# Patient Record
Sex: Male | Born: 1985 | Race: White | Hispanic: No | Marital: Single | Smoking: Never smoker
Health system: Southern US, Community
[De-identification: ages and names within clinical notes are randomized; demographics above are authoritative.]

---

## 2018-05-19 ENCOUNTER — Emergency Department: Payer: Self-pay

## 2018-05-19 ENCOUNTER — Other Ambulatory Visit: Payer: Self-pay

## 2018-05-19 ENCOUNTER — Encounter: Payer: Self-pay | Admitting: Emergency Medicine

## 2018-05-19 ENCOUNTER — Emergency Department
Admission: EM | Admit: 2018-05-19 | Discharge: 2018-05-19 | Disposition: A | Discharge status: Home / Self Care | Payer: Self-pay | Attending: Emergency Medicine | Admitting: Emergency Medicine

## 2018-05-19 DIAGNOSIS — K859 Acute pancreatitis without necrosis or infection, unspecified: Secondary | ICD-10-CM

## 2018-05-19 DIAGNOSIS — R748 Abnormal levels of other serum enzymes: Secondary | ICD-10-CM

## 2018-05-19 DIAGNOSIS — R197 Diarrhea, unspecified: Secondary | ICD-10-CM

## 2018-05-19 DIAGNOSIS — R112 Nausea with vomiting, unspecified: Secondary | ICD-10-CM

## 2018-05-19 DIAGNOSIS — F17200 Nicotine dependence, unspecified, uncomplicated: Secondary | ICD-10-CM | POA: Insufficient documentation

## 2018-05-19 LAB — URINALYSIS, COMPLETE (UACMP) WITH MICROSCOPIC
Bacteria, UA: NONE SEEN
Bilirubin Urine: NEGATIVE
Glucose, UA: NEGATIVE mg/dL
Hgb urine dipstick: NEGATIVE
Ketones, ur: 20 mg/dL — AB
LEUKOCYTES UA: NEGATIVE
Nitrite: NEGATIVE
PH: 5 (ref 5.0–8.0)
Protein, ur: NEGATIVE mg/dL
SPECIFIC GRAVITY, URINE: 1.02 (ref 1.005–1.030)

## 2018-05-19 LAB — COMPREHENSIVE METABOLIC PANEL
ALBUMIN: 4.6 g/dL (ref 3.5–5.0)
ALT: 16 U/L (ref 0–44)
AST: 18 U/L (ref 15–41)
Alkaline Phosphatase: 51 U/L (ref 38–126)
Anion gap: 11 (ref 5–15)
BUN: 13 mg/dL (ref 6–20)
CHLORIDE: 107 mmol/L (ref 98–111)
CO2: 23 mmol/L (ref 22–32)
CREATININE: 0.77 mg/dL (ref 0.61–1.24)
Calcium: 9.2 mg/dL (ref 8.9–10.3)
GFR calc Af Amer: >60 mL/min (ref >60)
GFR calc non Af Amer: >60 mL/min (ref >60)
GLUCOSE: 98 mg/dL (ref 70–99)
POTASSIUM: 3.5 mmol/L (ref 3.5–5.1)
Sodium: 141 mmol/L (ref 135–145)
Total Bilirubin: 0.9 mg/dL (ref 0.3–1.2)
Total Protein: 8 g/dL (ref 6.5–8.1)

## 2018-05-19 LAB — CBC
HEMATOCRIT: 44.2 % (ref 39.0–52.0)
HEMOGLOBIN: 15.2 g/dL (ref 13.0–17.0)
MCH: 29.1 pg (ref 26.0–34.0)
MCHC: 34.4 g/dL (ref 30.0–36.0)
MCV: 84.5 fL (ref 80.0–100.0)
Platelets: 265 10*3/uL (ref 150–400)
RBC: 5.23 MIL/uL (ref 4.22–5.81)
RDW: 12.3 % (ref 11.5–15.5)
WBC: 11.1 10*3/uL — ABNORMAL HIGH (ref 4.0–10.5)
nRBC: 0 % (ref 0.0–0.2)

## 2018-05-19 LAB — LIPASE, BLOOD: LIPASE: 97 U/L — AB (ref 11–51)

## 2018-05-19 MED ORDER — ONDANSETRON 4 MG PO TBDP
4.0000 mg | ORAL_TABLET | Freq: Once | ORAL | Status: AC
  Administered 2018-05-19: 4 mg via ORAL
  Filled 2018-05-19: qty 1

## 2018-05-19 MED ORDER — ONDANSETRON HCL 4 MG/2ML IJ SOLN: 4.0000 mg | Freq: Once | INTRAMUSCULAR | Status: DC

## 2018-05-19 MED ORDER — ONDANSETRON 4 MG PO TBDP: 4.0000 mg | ORAL_TABLET | Freq: Three times a day (TID) | ORAL | 0 refills | Status: DC | PRN

## 2018-05-19 MED ORDER — SODIUM CHLORIDE 0.9 % IV BOLUS: 1000.0000 mL | Freq: Once | INTRAVENOUS | Status: DC

## 2018-05-19 NOTE — ED Triage Notes (Signed)
First Nurse Note:  C/O N/V/D x 2 days.  AAOx3.  Skin warm and dry. Ambulates with easy and steady gait.  NAD

## 2018-05-19 NOTE — ED Provider Notes (Signed)
[facility] Emergency Department Provider Note  ____________________________________________  Time seen: Approximately 6:00 PM  I have reviewed the triage vital signs and the nursing notes.   HISTORY  Chief Complaint Emesis   HPI Frederick Parrish is a 32 y.o. male no significant past medical history who presents at the request of his employer for medical clearance for vomiting diarrhea.  Patient reports that his symptoms started yesterday.  He reports 5 episodes of nonbloody nonbilious emesis and more than 10 episodes of watery diarrhea.  Is complaining of mild epigastric abdominal pain that he rates as 2/10.  The pain is nonradiating and constant since this morning.  No fever but has had chills.  No history of C. difficile or recent antibiotic use, no melena, hematochezia, coffee-ground emesis or hematemesis.  No chest pain or shortness of breath.  Patient reports that he would have never come to the hospital for this but his employer requested medical clearance and he was unable to get in with his doctor.  PMH None -reviewed  Allergies Patient has no known allergies.  History reviewed. No pertinent family history.  Social History Social History   Tobacco Use  . Smoking status: Current Every Day Smoker  . Smokeless tobacco: Never Used  Substance Use Topics  . Alcohol use: Never    Frequency: Never  . Drug use: Yes    Types: Marijuana    Review of Systems  Constitutional: Negative for fever. Eyes: Negative for visual changes. ENT: Negative for sore throat. Neck: No neck pain  Cardiovascular: Negative for chest pain. Respiratory: Negative for shortness of breath. Gastrointestinal: + abdominal pain, vomiting and diarrhea. Genitourinary: Negative for dysuria. Musculoskeletal: Negative for back pain. Skin: Negative for rash. Neurological: Negative for headaches, weakness or numbness. Psych: No SI or  HI  ____________________________________________   PHYSICAL EXAM:  VITAL SIGNS: ED Triage Vitals  Enc Vitals Group     BP 05/19/18 1556 (!) 144/92     Pulse Rate 05/19/18 1556 94     Resp 05/19/18 1556 16     Temp --      Temp src --      SpO2 05/19/18 1556 99 %     Weight 05/19/18 1555 185 lb (83.9 kg)     Height 05/19/18 1555 5\' 9"  (1.753 m)     Head Circumference --      Peak Flow --      Pain Score 05/19/18 1555 0     Pain Loc --      Pain Edu? --      Excl. in GC? --     Constitutional: Alert and oriented. Well appearing and in no apparent distress. HEENT:      Head: Normocephalic and atraumatic.         Eyes: Conjunctivae are normal. Sclera is non-icteric.       Mouth/Throat: Mucous membranes are moist.       Neck: Supple with no signs of meningismus. Cardiovascular: Regular rate and rhythm. No murmurs, gallops, or rubs. 2+ symmetrical distal pulses are present in all extremities. No JVD. Respiratory: Normal respiratory effort. Lungs are clear to auscultation bilaterally. No wheezes, crackles, or rhonchi.  Gastrointestinal: Soft, mild epigastric ttp, and non distended with positive bowel sounds. No rebound or guarding. Genitourinary: No CVA tenderness. Musculoskeletal: Nontender with normal range of motion in all extremities. No edema, cyanosis, or erythema of extremities. Neurologic: Normal speech and language. Face is symmetric. Moving all extremities. No gross focal  neurologic deficits are appreciated. Skin: Skin is warm, dry and intact. No rash noted. Psychiatric: Mood and affect are normal. Speech and behavior are normal.  ____________________________________________   LABS (all labs ordered are listed, but only abnormal results are displayed)  Labs Reviewed  LIPASE, BLOOD - Abnormal; Notable for the following components:      Result Value   Lipase 97 (*)    All other components within normal limits  CBC - Abnormal; Notable for the following components:    WBC 11.1 (*)    All other components within normal limits  URINALYSIS, COMPLETE (UACMP) WITH MICROSCOPIC - Abnormal; Notable for the following components:   Color, Urine YELLOW (*)    APPearance CLEAR (*)    Ketones, ur 20 (*)    All other components within normal limits  COMPREHENSIVE METABOLIC PANEL   ____________________________________________  EKG  none  ____________________________________________  RADIOLOGY  I have personally reviewed the images performed during this visit and I agree with the Radiologist's read.   Interpretation by Radiologist:  US Abdomen Limited Ruq  Result Date: 05/19/2018 CLINICAL DATA:  Elevated lipase EXAM: ULTRASOUND ABDOMEN LIMITED RIGHT UPPER QUADRANT COMPARISON:  None. FINDINGS: Gallbladder: No gallstones or wall thickening visualized. No pericholecystic fluid. Tiny cholesterol crystals noted within the gallbladder. No sonographic Murphy sign noted by sonographer. Common bile duct: Diameter: 4 mm. No intrahepatic or extrahepatic biliary duct dilatation. Liver: No focal lesion identified. Within normal limits in parenchymal echogenicity. Portal vein is patent on color Doppler imaging with normal direction of blood flow towards the liver. No pancreatic mass or inflammatory focus evident. IMPRESSION: Tiny cholesterol crystals in gallbladder. Gallbladder otherwise appears normal. Study otherwise unremarkable. Electronically Signed   By: Bretta Bang III M.D.   On: 05/19/2018 18:12      ____________________________________________   PROCEDURES  Procedure(s) performed: None Procedures Critical Care performed:  None ____________________________________________   INITIAL IMPRESSION / ASSESSMENT AND PLAN / ED COURSE  32 y.o. male no significant past medical history who presents at the request of his employer for medical clearance for vomiting diarrhea.  Patient is well-appearing and in no distress, has normal vital signs, abdomen is soft  with mild epigastric tenderness.  Labs showing mildly elevated lipase at 97, normal CMP, mild leukocytosis of 11.1.  UA showing mild ketonuria.  Offer IV fluids however patient has refused and prefers to do p.o. hydration.  Will give Zofran and p.o. hydration.  We will send patient for right upper quadrant ultrasound to rule out gallstone pancreatitis since patient has no history of drinking alcohol.  Lipase could be also slightly elevated in the setting of vomiting from possible viral gastroenteritis.  Abdomen is otherwise benign with minimal tenderness in the epigastric region.  If ultrasound is negative patient passes p.o. challenge anticipate discharge home with Zofran, bland diet, and follow-up with primary care doctor.    _________________________ 7:14 PM on 05/19/2018 -----------------------------------------  Remains extremely well-appearing, tolerating p.o. with no difficulty, no further episodes of vomiting, abdomen remains benign with minimal tenderness in the epigastric region.  Ultrasound was done which shows tiny cholesterol crystals in the gallbladder with a normal common bile duct.  Due to this finding and elevated pancreatic enzymes I discussed the case with Dr. Duanne Guess, surgeon on-call who will see patient tomorrow at 2:30PM for follow-up as an outpatient as long as patient was tolerating p.o. discussed these recommendations with patient who feels comfortable with the plan.  Also recommended return to the emergency room if the  pain intensifies, if he develops a fever, or inability to tolerate p.o.   As part of my medical decision making, I reviewed the following data within the electronic MEDICAL RECORD NUMBER Nursing notes reviewed and incorporated, Labs reviewed , Old chart reviewed, Radiograph reviewed , A consult was requested and obtained from this/these consultant(s) Surgery, Notes from prior ED visits and [place] Controlled Substance Database    Pertinent labs & imaging results  that were available during my care of the patient were reviewed by me and considered in my medical decision making (see chart for details).    ____________________________________________   FINAL CLINICAL IMPRESSION(S) / ED DIAGNOSES  Final diagnoses:  Elevated lipase  Acute pancreatitis, unspecified complication status, unspecified pancreatitis type  Nausea vomiting and diarrhea      NEW MEDICATIONS STARTED DURING THIS VISIT:  ED Discharge Orders         Ordered    ondansetron (ZOFRAN ODT) 4 MG disintegrating tablet  Every 8 hours PRN     05/19/18 1909           Note:  This document was prepared using Dragon voice recognition software and may include unintentional dictation errors.    Nita SickleVeronese, [place], MD 05/19/18 [phone]

## 2018-05-19 NOTE — ED Triage Notes (Addendum)
NVD since yesterday. Vomit X 5 today and diarrhea all day per pt.  Unlabored, color WNL. VSS. Denies pain.  No urinary sx.  No fever.  Pt would also like work note.

## 2018-05-20 ENCOUNTER — Ambulatory Visit: Payer: Self-pay | Admitting: General Surgery

## 2019-05-26 ENCOUNTER — Other Ambulatory Visit: Payer: Self-pay

## 2019-05-26 DIAGNOSIS — Z20822 Contact with and (suspected) exposure to covid-19: Secondary | ICD-10-CM

## 2019-05-28 LAB — NOVEL CORONAVIRUS, NAA: SARS-CoV-2, NAA: NOT DETECTED

## 2019-10-18 DIAGNOSIS — K0889 Other specified disorders of teeth and supporting structures: Secondary | ICD-10-CM | POA: Diagnosis not present

## 2019-11-23 DIAGNOSIS — K0381 Cracked tooth: Secondary | ICD-10-CM | POA: Diagnosis not present

## 2019-12-20 DIAGNOSIS — K047 Periapical abscess without sinus: Secondary | ICD-10-CM | POA: Diagnosis not present

## 2020-01-27 DIAGNOSIS — M545 Low back pain: Secondary | ICD-10-CM | POA: Diagnosis not present

## 2020-01-27 DIAGNOSIS — M62830 Muscle spasm of back: Secondary | ICD-10-CM | POA: Diagnosis not present

## 2020-03-14 ENCOUNTER — Ambulatory Visit
Admission: EM | Admit: 2020-03-14 | Discharge: 2020-03-14 | Disposition: A | Discharge status: Home / Self Care | Payer: BC Managed Care – PPO | Attending: Family Medicine | Admitting: Family Medicine

## 2020-03-14 DIAGNOSIS — M5442 Lumbago with sciatica, left side: Secondary | ICD-10-CM

## 2020-03-14 MED ORDER — MELOXICAM 15 MG PO TABS: 15.0000 mg | ORAL_TABLET | Freq: Every day | ORAL | 0 refills | Status: DC | PRN

## 2020-03-14 NOTE — ED Triage Notes (Signed)
Patient in today w/ c/o sciatic pain. Patient states this is a recurring issue. Sx onset x 2 days.

## 2020-03-14 NOTE — ED Provider Notes (Signed)
[facility]    CSN: [id number] Arrival date & time: 03/14/20  1235      History   Chief Complaint Chief Complaint  Patient presents with  . Back Pain  . Sciatica   HPI  34 year old male presents with the above complaint.  2 day history of low back pain. Left sided. Radiates down the back into buttock/posterior thigh. Has had this previously and usually improves with aspirin and rest. Missed work due to this and is in need of a work note today. Worse with activity. Pain currently 5/10 in severity.  Home Medications    Prior to Admission medications   Medication Sig Start Date End Date Taking? Authorizing Provider  meloxicam (MOBIC) 15 MG tablet Take 1 tablet (15 mg total) by mouth daily as needed for pain. 03/14/20   Tommie Sams, DO   Social History Social History   Tobacco Use  . Smoking status: Current Every Day Smoker  . Smokeless tobacco: Never Used  Substance Use Topics  . Alcohol use: Never  . Drug use: Yes    Types: Marijuana     Allergies   Patient has no known allergies.   Review of Systems Review of Systems  Constitutional: Negative.   Musculoskeletal: Positive for back pain.   Physical Exam Triage Vital Signs ED Triage Vitals  Enc Vitals Group     BP 03/14/20 1313 (!) 147/103     Pulse Rate 03/14/20 1313 (!) 106     Resp 03/14/20 1313 18     Temp 03/14/20 1313 98.2 F (36.8 C)     Temp Source 03/14/20 1313 Oral     SpO2 03/14/20 1313 100 %     Weight 03/14/20 1315 185 lb (83.9 kg)     Height 03/14/20 1315 5\' 9"  (1.753 m)     Head Circumference --      Peak Flow --      Pain Score 03/14/20 1315 5     Pain Loc --      Pain Edu? --      Excl. in GC? --    Updated Vital Signs BP (!) 147/103 (BP Location: Left Arm)   Pulse (!) 106   Temp 98.2 F (36.8 C) (Oral)   Resp 18   Ht 5\' 9"  (1.753 m)   Wt 83.9 kg   SpO2 100%   BMI 27.32 kg/m   Visual Acuity Right Eye Distance:   Left Eye Distance:   Bilateral Distance:     Right Eye Near:   Left Eye Near:    Bilateral Near:     Physical Exam Vitals and nursing note reviewed.  Constitutional:      General: He is not in acute distress.    Appearance: Normal appearance. He is not ill-appearing.  HENT:     Head: Normocephalic and atraumatic.  Eyes:     General:        Right eye: No discharge.        Left eye: No discharge.     Conjunctiva/sclera: Conjunctivae normal.  Cardiovascular:     Rate and Rhythm: Normal rate and regular rhythm.  Pulmonary:     Effort: Pulmonary effort is normal.     Breath sounds: Normal breath sounds.  Musculoskeletal:     Comments: No significant tenderness of the lumbar spine.  Neurological:     Mental Status: He is alert.  Psychiatric:        Mood and Affect: Mood normal.  Behavior: Behavior normal.    UC Treatments / Results  Labs (all labs ordered are listed, but only abnormal results are displayed) Labs Reviewed - No data to display  EKG   Radiology No results found.  Procedures Procedures (including critical care time)  Medications Ordered in UC Medications - No data to display  Initial Impression / Assessment and Plan / UC Course  I have reviewed the triage vital signs and the nursing notes.  Pertinent labs & imaging results that were available during my care of the patient were reviewed by me and considered in my medical decision making (see chart for details).    34 year old male presents with acute low back pain with sciatica. Treating with Mobic. Work note given.   Final Clinical Impressions(s) / UC Diagnoses   Final diagnoses:  Acute left-sided low back pain with left-sided sciatica   Discharge Instructions   None    ED Prescriptions    Medication Sig Dispense Auth. Provider   meloxicam (MOBIC) 15 MG tablet Take 1 tablet (15 mg total) by mouth daily as needed for pain. 30 tablet Tommie Sams, DO     PDMP not reviewed this encounter.   Tommie Sams, [place] 03/14/20  2128

## 2020-04-12 DIAGNOSIS — Z20822 Contact with and (suspected) exposure to covid-19: Secondary | ICD-10-CM | POA: Diagnosis not present

## 2020-04-12 DIAGNOSIS — Z03818 Encounter for observation for suspected exposure to other biological agents ruled out: Secondary | ICD-10-CM | POA: Diagnosis not present

## 2020-04-25 DIAGNOSIS — N50812 Left testicular pain: Secondary | ICD-10-CM | POA: Diagnosis not present

## 2020-07-10 DIAGNOSIS — K5900 Constipation, unspecified: Secondary | ICD-10-CM | POA: Diagnosis not present

## 2020-07-11 ENCOUNTER — Encounter: Payer: Self-pay | Admitting: Family Medicine

## 2020-07-11 ENCOUNTER — Other Ambulatory Visit: Payer: Self-pay

## 2020-07-11 ENCOUNTER — Ambulatory Visit (INDEPENDENT_AMBULATORY_CARE_PROVIDER_SITE_OTHER): Payer: BC Managed Care – PPO | Admitting: Family Medicine

## 2020-07-11 VITALS — BP 128/72 | HR 90 | Ht 69.0 in | Wt 182.0 lb

## 2020-07-11 DIAGNOSIS — K4090 Unilateral inguinal hernia, without obstruction or gangrene, not specified as recurrent: Secondary | ICD-10-CM | POA: Diagnosis not present

## 2020-07-11 DIAGNOSIS — Z2821 Immunization not carried out because of patient refusal: Secondary | ICD-10-CM

## 2020-07-11 DIAGNOSIS — Z7689 Persons encountering health services in other specified circumstances: Secondary | ICD-10-CM | POA: Diagnosis not present

## 2020-07-11 NOTE — Patient Instructions (Addendum)
Thank you for coming to the office today.  Stay tuned for apt from General Surgery for more information.   DUE for FASTING BLOOD WORK (no food or drink after midnight before the lab appointment, only water or coffee without cream/sugar on the morning of)  SCHEDULE "Lab Only" visit in the morning at the clinic for lab draw in 6 MONTHS   - Make sure Lab Only appointment is at about 1 week before your next appointment, so that results will be available  For Lab Results, once available within 2-3 days of blood draw, you can can log in to MyChart online to view your results and a brief explanation. Also, we can discuss results at next follow-up visit.   Please schedule a Follow-up Appointment to: Return in about 6 months (around 01/08/2021) for 6 month fasting lab only then 1 week later Annual Physical.  If you have any other questions or concerns, please feel free to call the office or send a message through MyChart. You may also schedule an earlier appointment if necessary.  Additionally, you may be receiving a survey about your experience at our office within a few days to 1 week by e-mail or mail. We value your feedback.  Saralyn Pilar, DO [facility], [place]

## 2020-07-11 NOTE — Progress Notes (Signed)
Subjective:    Patient ID: Frederick Parrish, male    DOB: 11/01/1985, 35 y.o.   MRN: [id number]  Frederick Parrish is a 35 y.o. male presenting on 07/11/2020 for Establish Care and Back Pain  Here to establish care with new PCP.  HPI   Right Low Back Pain / Right Groin Pain - possible Hernia  Reports symptoms onset >4 months with episodic problem with pain R sided possible hernia, he has a concern with Right lower abdominal or inguinal symptoms with pressure and possible bulging or pushing from inside causing pain, seems to be episodic not every day, and Right low back pain worsening with more physical nature, worse only with working and lifting. - No prior hernia in past - No prior surgery or abdominal procedure - He does a lot of lifting for work. He works for [employer], works Warden/ranger, he does some machine, with some lifting and stamping.  He admits days when working more and lifting more he will have worse symptoms. Tried Ibuprofen as needed with some relief of back pain.  He had prior urgent care walk in visit 03/2020 for similar issue, dx with left testicular pain, had urinalysis and no obvious hernia on exam, they gave him Doxycycline.  He has worse symptoms with laying down can feel bulging.  Admits constipation. Tried vegetable juice for bowels. He admits occasional episodes of this often. History of Sciatica with low back pain.  He takes Kratom PRN with some urinary stream difficulties.   Plantar Wart, Left foot Reports onset plantar wart, he has done a freezing treatment with good results, he may try this again, it hasn't resolved.    Depression screen PHQ 2/9 07/11/2020  Decreased Interest 0  Down, Depressed, Hopeless 0  PHQ - 2 Score 0    History reviewed. No pertinent past medical history. History reviewed. No pertinent surgical history. Social History   Socioeconomic History  . Marital status: Single    Spouse name: Not on file  .  Number of children: Not on file  . Years of education: Not on file  . Highest education level: Not on file  Occupational History  . Not on file  Tobacco Use  . Smoking status: Never Smoker  . Smokeless tobacco: Never Used  Vaping Use  . Vaping Use: Some days  Substance and Sexual Activity  . Alcohol use: Never  . Drug use: Yes    Types: Marijuana  . Sexual activity: Not on file  Other Topics Concern  . Not on file  Social History Narrative  . Not on file   Social Determinants of Health   Financial Resource Strain: Not on file  Food Insecurity: Not on file  Transportation Needs: Not on file  Physical Activity: Not on file  Stress: Not on file  Social Connections: Not on file  Intimate Partner Violence: Not on file   Family History  Problem Relation Age of Onset  . Alcohol abuse Mother   . Diabetes Paternal Grandfather   . Cancer Neg Hx    Current Outpatient Medications on File Prior to Visit  Medication Sig  . Ascorbic Acid (VITAMIN C) 1000 MG tablet Take 1,000 mg by mouth daily.  . Multiple Vitamin (MULTIVITAMIN) tablet Take 1 tablet by mouth daily.   No current facility-administered medications on file prior to visit.    Review of Systems Per HPI unless specifically indicated above     Objective:    BP 128/72  Pulse 90   Ht 5\' 9"  (1.753 m)   Wt 182 lb (82.6 kg)   SpO2 100%   BMI 26.88 kg/m   Wt Readings from Last 3 Encounters:  07/11/20 182 lb (82.6 kg)  03/14/20 185 lb (83.9 kg)  05/19/18 185 lb (83.9 kg)    Physical Exam Vitals and nursing note reviewed.  Constitutional:      General: He is not in acute distress.    Appearance: He is well-developed and well-nourished. He is not diaphoretic.     Comments: Well-appearing, comfortable, cooperative  HENT:     Head: Normocephalic and atraumatic.     Mouth/Throat:     Mouth: Oropharynx is clear and moist.  Eyes:     General:        Right eye: No discharge.        Left eye: No discharge.      Conjunctiva/sclera: Conjunctivae normal.  Cardiovascular:     Rate and Rhythm: Normal rate.  Pulmonary:     Effort: Pulmonary effort is normal.  Abdominal:     General: Bowel sounds are normal. There is no distension.     Palpations: Abdomen is soft. There is no mass.     Tenderness: There is no abdominal tenderness. There is no guarding or rebound.     Hernia: No hernia (No Abdominal hernia) is present.  Genitourinary:    Penis: Normal.      Testes: Normal.     Comments: Normal external genital exam. Normal hernia exam without any provoked herniation or bulging bilateral inguinal canals. He does have some mild provoked symptoms on deep palpation at R inguinal canal opening. Musculoskeletal:        General: No edema.     Comments: Palpable muscle hypertonicity knot on R low back paraspinal lumbar  Skin:    General: Skin is warm and dry.     Findings: No erythema or rash.  Neurological:     Mental Status: He is alert and oriented to person, place, and time.  Psychiatric:        Mood and Affect: Mood and affect normal.        Behavior: Behavior normal.     Comments: Well groomed, good eye contact, normal speech and thoughts    Results for orders placed or performed in visit on 05/26/19  Novel Coronavirus, NAA ([facility])   Specimen: Nasopharyngeal(NP) swabs in vial transport medium   NASOPHARYNGE  TESTING  Result Value Ref Range   SARS-CoV-2, NAA Not Detected Not Detected      Assessment & Plan:   Problem List Items Addressed This Visit    COVID-19 vaccination declined    Other Visit Diagnoses    Right inguinal hernia    -  Primary   Relevant Orders   Ambulatory referral to General Surgery   Encounter to establish care with new doctor          to general surgery for evaluation of possible Right inguinal hernia, given >3 months of episodic classic hernia symptoms with increasing pain and pressure bulging symptoms but then can have episodes of no pain. He does heavy lifting  for work and without obvious injury but still would like to rule out hernia first, then can pursue other options, may warrant imaging if difficult to determine, I could not reproduce his symptoms or hernia.   Alternatively symptoms may be related to chronic low back pain, R sided sciatica.    No orders of the defined types  were placed in this encounter.  Orders Placed This Encounter  Procedures  . Ambulatory referral to General Surgery    Referral Priority:   Routine    Referral Type:   Surgical    Referral Reason:   Specialty Services Required    Requested Specialty:   General Surgery    Number of Visits Requested:   1      Follow up plan: Return in about 6 months (around 01/08/2021) for 6 month fasting lab only then 1 week later Annual Physical.  Saralyn Pilar, DO [facility] 07/11/2020, 3:25 PM

## 2020-07-12 ENCOUNTER — Ambulatory Visit
Admission: EM | Admit: 2020-07-12 | Discharge: 2020-07-12 | Disposition: A | Discharge status: Home / Self Care | Payer: BC Managed Care – PPO | Attending: Family Medicine | Admitting: Family Medicine

## 2020-07-12 ENCOUNTER — Encounter: Payer: Self-pay | Admitting: Emergency Medicine

## 2020-07-12 DIAGNOSIS — B349 Viral infection, unspecified: Secondary | ICD-10-CM | POA: Diagnosis not present

## 2020-07-12 DIAGNOSIS — U071 COVID-19: Secondary | ICD-10-CM | POA: Insufficient documentation

## 2020-07-12 LAB — RAPID INFLUENZA A&B ANTIGENS
Influenza A (ARMC): NEGATIVE
Influenza B (ARMC): NEGATIVE

## 2020-07-12 MED ORDER — KETOROLAC TROMETHAMINE 10 MG PO TABS: 10.0000 mg | ORAL_TABLET | Freq: Four times a day (QID) | ORAL | 0 refills | Status: DC | PRN

## 2020-07-12 NOTE — ED Provider Notes (Signed)
[facility]    CSN: [id number] Arrival date & time: 07/12/20  1321      History   Chief Complaint Chief Complaint  Patient presents with  . Constipation  . Fever  . Nausea  . Nasal Congestion  . Generalized Body Aches    HPI  35 year old male presents with above complaints.  Patient reports that his symptoms started yesterday.  He reports body aches, sore throat, headaches, fever.  He states nausea as well.  Also reports congestion.  No cough.  He has taken Motrin without resolution.  Patient recently been seen on Monday for constipation at an outside urgent care.  Also seen by his PCP yesterday.  He states that his symptoms started after he visited his PCP yesterday.  No relieving factors.  Has not been vaccinated.  No other complaints at this time.   Home Medications    Prior to Admission medications   Medication Sig Start Date End Date Taking? Authorizing Provider  ketorolac (TORADOL) 10 MG tablet Take 1 tablet (10 mg total) by mouth every 6 (six) hours as needed for moderate pain or severe pain. 07/12/20  Yes Uthman Mroczkowski G, DO  Ascorbic Acid (VITAMIN C) 1000 MG tablet Take 1,000 mg by mouth daily.    [provider]  Multiple Vitamin (MULTIVITAMIN) tablet Take 1 tablet by mouth daily.    [provider]    Family History Family History  Problem Relation Age of Onset  . Alcohol abuse Mother   . Diabetes Paternal Grandfather   . Cancer Neg Hx     Social History Social History   Tobacco Use  . Smoking status: Never Smoker  . Smokeless tobacco: Never Used  Vaping Use  . Vaping Use: Some days  Substance Use Topics  . Alcohol use: Never  . Drug use: Yes    Types: Marijuana     Allergies   Patient has no known allergies.   Review of Systems Review of Systems Per HPI  Physical Exam Triage Vital Signs ED Triage Vitals  Enc Vitals Group     BP 07/12/20 1457 122/84     Pulse Rate 07/12/20 1457 78     Resp 07/12/20 1457 17      Temp 07/12/20 1457 99.3 F (37.4 C)     Temp Source 07/12/20 1457 Oral     SpO2 07/12/20 1457 98 %     Weight --      Height --      Head Circumference --      Peak Flow --      Pain Score 07/12/20 1455 6     Pain Loc --      Pain Edu? --      Excl. in GC? --    Updated Vital Signs BP 122/84 (BP Location: Left Arm)   Pulse 78   Temp 99.3 F (37.4 C) (Oral)   Resp 17   SpO2 98%   Visual Acuity Right Eye Distance:   Left Eye Distance:   Bilateral Distance:    Right Eye Near:   Left Eye Near:    Bilateral Near:     Physical Exam Vitals and nursing note reviewed.  Constitutional:      General: He is not in acute distress.    Appearance: Normal appearance. He is not ill-appearing.  HENT:     Head: Normocephalic and atraumatic.     Mouth/Throat:     Pharynx: Oropharynx is clear. No oropharyngeal exudate.  Cardiovascular:     Rate and Rhythm: Normal rate and regular rhythm.     Heart sounds: No murmur heard.   Pulmonary:     Effort: Pulmonary effort is normal.     Breath sounds: Normal breath sounds. No wheezing, rhonchi or rales.  Neurological:     Mental Status: He is alert.  Psychiatric:        Mood and Affect: Mood normal.        Behavior: Behavior normal.    UC Treatments / Results  Labs (all labs ordered are listed, but only abnormal results are displayed) Labs Reviewed  RAPID INFLUENZA A&B ANTIGENS  SARS CORONAVIRUS 2 (TAT 6-24 HRS)    EKG   Radiology No results found.  Procedures Procedures (including critical care time)  Medications Ordered in UC Medications - No data to display  Initial Impression / Assessment and Plan / UC Course  I have reviewed the triage vital signs and the nursing notes.  Pertinent labs & imaging results that were available during my care of the patient were reviewed by me and considered in my medical decision making (see chart for details).    35 year old male presents with viral illness.  Flu negative.   Concern for COVID-19.  Toradol for body aches.  Supportive care.  Final Clinical Impressions(s) / UC Diagnoses   Final diagnoses:  Viral illness     Discharge Instructions     Medication as prescribed.  Stay home.  Check my chart for COVID test results.  Take care  Dr. Adriana Simas     ED Prescriptions    Medication Sig Dispense Auth. Provider   ketorolac (TORADOL) 10 MG tablet Take 1 tablet (10 mg total) by mouth every 6 (six) hours as needed for moderate pain or severe pain. 20 tablet Tommie Sams, DO     PDMP not reviewed this encounter.   Tommie Sams, [place] 07/12/20 1637

## 2020-07-12 NOTE — Discharge Instructions (Signed)
Medication as prescribed.  Stay home.  Check my chart for COVID test results.  Take care  Dr. Finesse Fielder   

## 2020-07-12 NOTE — ED Triage Notes (Signed)
Pt states that he has  HA, sore throat, body aches, nasal congestion, fever, and nausea. Pt states that his sx started yesterday excluding constipation which started Monday. Pt states that he took four Ibuprofen this morning.

## 2020-07-13 LAB — SARS CORONAVIRUS 2 (TAT 6-24 HRS): SARS Coronavirus 2: POSITIVE — AB

## 2020-07-14 ENCOUNTER — Telehealth: Payer: Self-pay | Admitting: Nurse Practitioner

## 2020-07-14 NOTE — Telephone Encounter (Signed)
Attempted to contact patient for screening for Covid monoclonal antibody infusion or antiviral treatment.   Phone number listed for patient is incorrect.   Mychart message sent.

## 2020-07-20 ENCOUNTER — Ambulatory Visit: Payer: Self-pay | Admitting: Surgery

## 2020-07-27 ENCOUNTER — Encounter: Payer: Self-pay | Admitting: Surgery

## 2020-07-27 ENCOUNTER — Ambulatory Visit (INDEPENDENT_AMBULATORY_CARE_PROVIDER_SITE_OTHER): Payer: BC Managed Care – PPO | Admitting: Surgery

## 2020-07-27 ENCOUNTER — Other Ambulatory Visit: Payer: Self-pay

## 2020-07-27 VITALS — BP 144/88 | HR 96 | Temp 99.0°F | Ht 69.0 in | Wt 177.0 lb

## 2020-07-27 DIAGNOSIS — N50811 Right testicular pain: Secondary | ICD-10-CM | POA: Diagnosis not present

## 2020-07-27 DIAGNOSIS — N50812 Left testicular pain: Secondary | ICD-10-CM

## 2020-07-27 NOTE — Progress Notes (Signed)
Patient ID: Frederick Parrish, male   DOB: 1985/07/11, 35 y.o.   MRN: [id number]  Chief Complaint: Right testicular pain.  History of Present Illness Frederick Parrish is a 35 y.o. male with a multimonth history of right testicular pain.  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  He denies any prior sense of a bulge or mass in the groin region.  The pain is primarily exacerbated when laying supine, and seems to be alleviated when he changes position.  The pain is primarily right testicular which radiates into the perineum and perianal area.  He denies any association with sexual activity, however he is concerned about a diminished libido.  Does not appear to have any of the pain at work or when he is upright.  Concerns have been raised previously of intermittent torsion. He denies voiding issues.  Past Medical History History reviewed. No pertinent past medical history.    History reviewed. No pertinent surgical history.  No Known Allergies  Current Outpatient Medications  Medication Sig Dispense Refill  . Ascorbic Acid (VITAMIN C) 1000 MG tablet Take 1,000 mg by mouth daily.    . Multiple Vitamin (MULTIVITAMIN) tablet Take 1 tablet by mouth daily.     No current facility-administered medications for this visit.    Family History Family History  Problem Relation Age of Onset  . Alcohol abuse Mother   . Diabetes Paternal Grandfather   . Cancer Neg Hx       Social History Social History   Tobacco Use  . Smoking status: Never Smoker  . Smokeless tobacco: Never Used  Vaping Use  . Vaping Use: Some days  Substance Use Topics  . Alcohol use: Never  . Drug use: Yes    Types: Marijuana        Review of Systems  Constitutional: Negative.   HENT: Negative.   Eyes: Negative.   Respiratory: Negative.   Cardiovascular: Negative.   Gastrointestinal: Negative.   Genitourinary: Negative for  dysuria, hematuria and urgency.  Musculoskeletal: Negative.   Skin: Negative.   Neurological: Negative.   Psychiatric/Behavioral: Negative.       Physical Exam Blood pressure (!) 144/88, pulse 96, temperature 99 F (37.2 C), temperature source Oral, height 5\' 9"  (1.753 m), weight 177 lb (80.3 kg), SpO2 99 %. Last Weight  Most recent update: 07/27/2020  9:27 AM   Weight  80.3 kg (177 lb)            CONSTITUTIONAL: Well developed, and nourished, appropriately responsive and aware without distress.   EYES: Sclera non-icteric.   EARS, NOSE, MOUTH AND THROAT: Mask worn.     Hearing is intact to voice.  NECK: Trachea is midline, and there is no jugular venous distension.  LYMPH NODES:  Lymph nodes in the neck are not enlarged. RESPIRATORY:  Lungs are clear, and breath sounds are equal bilaterally. Normal respiratory effort without pathologic use of accessory muscles. CARDIOVASCULAR: Heart is regular in rate and rhythm. GI: The abdomen is  soft, nontender, and nondistended. There were no palpable masses. I did not appreciate hepatosplenomegaly. There were normal bowel sounds. GU: quite protective during exam, tender right scrotal area, no palpable testicular abnormality with limited exam.  No appreciable inguinal hernia or either side.   MUSCULOSKELETAL:  Symmetrical muscle tone appreciated in all four extremities.    SKIN: Skin turgor is normal. No pathologic skin lesions appreciated.  NEUROLOGIC:  Motor and sensation appear grossly normal.  Cranial nerves are grossly without defect. PSYCH:  Alert and oriented to person, place and time. Affect is appropriate for situation.  Data Reviewed I have personally reviewed what is currently available of the patient's imaging, recent labs and medical records.   Labs:  CBC Latest Ref Rng & Units 05/19/2018  WBC 4.0 - 10.5 K/uL 11.1(H)  Hemoglobin 13.0 - 17.0 g/dL 28.3  Hematocrit 66.2 - 52.0 % 44.2  Platelets 150 - 400 K/uL 265   CMP Latest  Ref Rng & Units 05/19/2018  Glucose 70 - 99 mg/dL 98  BUN 6 - 20 mg/dL 13  Creatinine 9.47 - 6.54 mg/dL 6.50  Sodium 354 - 656 mmol/L 141  Potassium 3.5 - 5.1 mmol/L 3.5  Chloride 98 - 111 mmol/L 107  CO2 22 - 32 mmol/L 23  Calcium 8.9 - 10.3 mg/dL 9.2  Total Protein 6.5 - 8.1 g/dL 8.0  Total Bilirubin 0.3 - 1.2 mg/dL 0.9  Alkaline Phos 38 - 126 U/L 51  AST 15 - 41 U/L 18  ALT 0 - 44 U/L 16      Imaging: Radiology review:  Limited abd u/s 2019.   Within last 24 hrs: No results found.  Assessment    Right testicular pain.  Patient Active Problem List   Diagnosis Date Noted  . COVID-19 vaccination declined 07/11/2020    Plan    Consider scrotal u/s, as for now will proceed with pelvic CT and f/u after.   Face-to-face time spent with the patient and accompanying care providers(if present) was 30 minutes, with more than 50% of the time spent counseling, educating, and coordinating care of the patient.      Campbell Lerner M.D., FACS 07/27/2020, 9:50 AM

## 2020-07-27 NOTE — Patient Instructions (Addendum)
CT scan Pelvis scheduled for 08/01/20 @ 2:15 pm @ [facility]patient [facility]. Nothing to eat or drink 4 hours prior- Go to outpatient imaging today to pick up your prep kit and instructions.   [place]   Please see your follow up appointment listed below.

## 2020-07-31 ENCOUNTER — Other Ambulatory Visit: Payer: Self-pay

## 2020-07-31 ENCOUNTER — Ambulatory Visit
Admission: RE | Admit: 2020-07-31 | Discharge: 2020-07-31 | Disposition: A | Discharge status: Home / Self Care | Payer: BC Managed Care – PPO | Source: Ambulatory Visit | Attending: Surgery | Admitting: Surgery

## 2020-07-31 DIAGNOSIS — M545 Low back pain, unspecified: Secondary | ICD-10-CM | POA: Diagnosis not present

## 2020-07-31 DIAGNOSIS — R109 Unspecified abdominal pain: Secondary | ICD-10-CM | POA: Diagnosis not present

## 2020-07-31 DIAGNOSIS — N50812 Left testicular pain: Secondary | ICD-10-CM | POA: Diagnosis not present

## 2020-07-31 DIAGNOSIS — N433 Hydrocele, unspecified: Secondary | ICD-10-CM | POA: Diagnosis not present

## 2020-07-31 DIAGNOSIS — N50811 Right testicular pain: Secondary | ICD-10-CM | POA: Diagnosis not present

## 2020-07-31 MED ORDER — IOHEXOL 300 MG/ML  SOLN
100.0000 mL | Freq: Once | INTRAMUSCULAR | Status: AC | PRN
  Administered 2020-07-31: 100 mL via INTRAVENOUS

## 2020-08-01 ENCOUNTER — Ambulatory Visit: Admission: RE | Admit: 2020-08-01 | Payer: BC Managed Care – PPO | Source: Ambulatory Visit

## 2020-08-02 ENCOUNTER — Other Ambulatory Visit: Payer: Self-pay | Admitting: Surgery

## 2020-08-02 DIAGNOSIS — N50811 Right testicular pain: Secondary | ICD-10-CM

## 2020-08-02 DIAGNOSIS — N50812 Left testicular pain: Secondary | ICD-10-CM

## 2020-08-03 ENCOUNTER — Ambulatory Visit (INDEPENDENT_AMBULATORY_CARE_PROVIDER_SITE_OTHER): Payer: BC Managed Care – PPO | Admitting: Surgery

## 2020-08-03 ENCOUNTER — Telehealth: Payer: Self-pay

## 2020-08-03 ENCOUNTER — Other Ambulatory Visit: Payer: Self-pay

## 2020-08-03 ENCOUNTER — Encounter: Payer: Self-pay | Admitting: Surgery

## 2020-08-03 VITALS — BP 125/82 | HR 101 | Temp 98.8°F | Ht 69.0 in | Wt 176.6 lb

## 2020-08-03 DIAGNOSIS — N50811 Right testicular pain: Secondary | ICD-10-CM

## 2020-08-03 DIAGNOSIS — N50812 Left testicular pain: Secondary | ICD-10-CM | POA: Diagnosis not present

## 2020-08-03 NOTE — Telephone Encounter (Signed)
Attempted to call pt to let him know that his [place] has been scheduled for 08/07/2020. Arrive at 10:45 am, appt is at 11 am. Unable to LVM.

## 2020-08-03 NOTE — Progress Notes (Signed)
Patient ID: Frederick Parrish, male   DOB: 06/27/1986, 35 y.o.   MRN: [id number]  Chief Complaint: Right testicular pain.  History of Present Illness Frederick Parrish is a 35 y.o. male who returns in follow-up after his CT scan examination with history of right testicular pain.  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  He denies any prior sense of a bulge or mass in the groin region.  The pain is primarily exacerbated when laying supine, and seems to be alleviated when he changes position.  The pain is primarily right testicular which radiates into the perineum and perianal area.  He denies any association with sexual activity, however he is concerned about a diminished libido.  Does not appear to have any of the pain at work or when he is upright.  Concerns have been raised previously of intermittent torsion. He denies voiding issues.  Past Medical History History reviewed. No pertinent past medical history.    History reviewed. No pertinent surgical history.  No Known Allergies  Current Outpatient Medications  Medication Sig Dispense Refill  . Ascorbic Acid (VITAMIN C) 1000 MG tablet Take 1,000 mg by mouth daily.    . Multiple Vitamin (MULTIVITAMIN) tablet Take 1 tablet by mouth daily.     No current facility-administered medications for this visit.    Family History Family History  Problem Relation Age of Onset  . Alcohol abuse Mother   . Diabetes Paternal Grandfather   . Cancer Neg Hx       Social History Social History   Tobacco Use  . Smoking status: Never Smoker  . Smokeless tobacco: Never Used  Vaping Use  . Vaping Use: Some days  Substance Use Topics  . Alcohol use: Never  . Drug use: Yes    Types: Marijuana        Review of Systems  Constitutional: Negative.   HENT: Negative.   Eyes: Negative.   Respiratory: Negative.   Cardiovascular: Negative.   Gastrointestinal:  Negative.   Genitourinary: Negative for dysuria, hematuria and urgency.  Musculoskeletal: Negative.   Skin: Negative.   Neurological: Negative.   Psychiatric/Behavioral: Negative.       Physical Exam Blood pressure 125/82, pulse (!) 101, temperature 98.8 F (37.1 C), temperature source Oral, height 5\' 9"  (1.753 m), weight 176 lb 9.6 oz (80.1 kg), SpO2 99 %. Last Weight  Most recent update: 08/03/2020  9:19 AM   Weight  80.1 kg (176 lb 9.6 oz)            CONSTITUTIONAL: Well developed, and nourished, appropriately responsive and aware without distress.   EYES: Sclera non-icteric.   EARS, NOSE, MOUTH AND THROAT: Mask worn.     Hearing is intact to voice.  NECK: Trachea is midline, and there is no jugular venous distension.  LYMPH NODES:  Lymph nodes in the neck are not enlarged. RESPIRATORY:  Lungs are clear, and breath sounds are equal bilaterally. Normal respiratory effort without pathologic use of accessory muscles. CARDIOVASCULAR: Heart is regular in rate and rhythm. GI: The abdomen is  soft, nontender, and nondistended. There were no palpable masses. I did not appreciate hepatosplenomegaly. There were normal bowel sounds. GU: quite protective during exam, tender right scrotal area, no palpable testicular abnormality with limited exam.  No appreciable inguinal hernia or either side.   MUSCULOSKELETAL:  Symmetrical muscle tone appreciated in all four extremities.    SKIN:  Skin turgor is normal. No pathologic skin lesions appreciated.  NEUROLOGIC:  Motor and sensation appear grossly normal.  Cranial nerves are grossly without defect. PSYCH:  Alert and oriented to person, place and time. Affect is appropriate for situation.  Data Reviewed I have personally reviewed what is currently available of the patient's imaging, recent labs and medical records.   Labs:  CBC Latest Ref Rng & Units 05/19/2018  WBC 4.0 - 10.5 K/uL 11.1(H)  Hemoglobin 13.0 - 17.0 g/dL 34.7  Hematocrit 42.5 -  52.0 % 44.2  Platelets 150 - 400 K/uL 265   CMP Latest Ref Rng & Units 05/19/2018  Glucose 70 - 99 mg/dL 98  BUN 6 - 20 mg/dL 13  Creatinine 9.56 - 3.87 mg/dL 5.64  Sodium 332 - 951 mmol/L 141  Potassium 3.5 - 5.1 mmol/L 3.5  Chloride 98 - 111 mmol/L 107  CO2 22 - 32 mmol/L 23  Calcium 8.9 - 10.3 mg/dL 9.2  Total Protein 6.5 - 8.1 g/dL 8.0  Total Bilirubin 0.3 - 1.2 mg/dL 0.9  Alkaline Phos 38 - 126 U/L 51  AST 15 - 41 U/L 18  ALT 0 - 44 U/L 16      Imaging: Radiology review:  Limited abd u/s 2019.    CLINICAL DATA:  Testicular, low back and intermittent right-sided flank pain for 1 year.  EXAM: CT PELVIS WITH CONTRAST  TECHNIQUE: Multidetector CT imaging of the pelvis was performed using the standard protocol following the bolus administration of intravenous contrast.  CONTRAST:  OMNIPAQUE IOHEXOL 300 MG/ML  SOLN  COMPARISON:  None.  FINDINGS: Urinary Tract:  Distal ureters and urinary bladder are unremarkable.  Bowel: High attenuation enteric contrast media traverses to the level of the distal sigmoid. No conspicuous mural thickening, dilatation or evidence of bowel obstruction within the included portions the large or small bowel. Normal appendix in the right lower quadrant.  Vascular/Lymphatic: No pathologically enlarged lymph nodes. No significant vascular abnormality seen.  Reproductive: Normal appearance of the prostate and seminal vesicles. Question a trace left hydrocele and small scrotolith, incompletely characterized on this exam. Additionally, there may be some mild prominence of the vascularity in the inguinal canal/scrotum which could suggest a varicocele though would be better characterized on dedicated scrotal sonography. No other gross abnormality of the included external genitalia.  Other: Minimal fat seen within the proximal inguinal canals. No bowel containing hernias. No pelvic free air or fluid.  Musculoskeletal: No  acute osseous abnormality or suspicious osseous lesion.  IMPRESSION: Trace left hydrocele and small scrotolith, and possible varicoceles incompletely characterized on this exam. Consider dedicated scrotal ultrasound.  Minimal protrusion of fat into the proximal inguinal canals without bowel containing hernia.  No other acute or worrisome abnormalities.   Electronically Signed   By: Kreg Shropshire M.D.   On: 07/31/2020 22:33  Within last 24 hrs: No results found.    Assessment    Right testicular pain.  Associated lower right lumbar back pain with history of sciatica reported. Patient Active Problem List   Diagnosis Date Noted  . COVID-19 vaccination declined 07/11/2020    Plan    Scrotal u/s, urological consultation, and f/u after 6 wks.   Face-to-face time spent with the patient and accompanying care providers(if present) was 15 minutes, with more than 50% of the time spent counseling, educating, and coordinating care of the patient.      Campbell Lerner M.D., FACS 08/03/2020, 10:35 AM

## 2020-08-03 NOTE — Patient Instructions (Addendum)
We have put in a referral to Urology. They will call you to set up an appointment. I will call you with your appointment for your ultrasound. See follow up appointment below.

## 2020-08-07 ENCOUNTER — Ambulatory Visit
Admission: RE | Admit: 2020-08-07 | Discharge: 2020-08-07 | Disposition: A | Discharge status: Home / Self Care | Payer: BC Managed Care – PPO | Source: Ambulatory Visit | Attending: Surgery | Admitting: Surgery

## 2020-08-07 ENCOUNTER — Other Ambulatory Visit: Payer: Self-pay

## 2020-08-07 DIAGNOSIS — N5089 Other specified disorders of the male genital organs: Secondary | ICD-10-CM | POA: Diagnosis not present

## 2020-08-07 DIAGNOSIS — N433 Hydrocele, unspecified: Secondary | ICD-10-CM | POA: Diagnosis not present

## 2020-08-07 DIAGNOSIS — N50811 Right testicular pain: Secondary | ICD-10-CM | POA: Diagnosis not present

## 2020-08-07 DIAGNOSIS — N50812 Left testicular pain: Secondary | ICD-10-CM | POA: Diagnosis not present

## 2020-08-07 DIAGNOSIS — Z8679 Personal history of other diseases of the circulatory system: Secondary | ICD-10-CM | POA: Diagnosis not present

## 2020-08-11 ENCOUNTER — Ambulatory Visit: Payer: BC Managed Care – PPO | Admitting: Urology

## 2020-09-14 ENCOUNTER — Other Ambulatory Visit: Payer: Self-pay

## 2020-09-14 ENCOUNTER — Ambulatory Visit (INDEPENDENT_AMBULATORY_CARE_PROVIDER_SITE_OTHER): Payer: BC Managed Care – PPO | Admitting: Surgery

## 2020-09-14 ENCOUNTER — Encounter: Payer: Self-pay | Admitting: Surgery

## 2020-09-14 VITALS — BP 129/75 | HR 106 | Temp 98.4°F | Ht 69.0 in | Wt 175.0 lb

## 2020-09-14 DIAGNOSIS — N50811 Right testicular pain: Secondary | ICD-10-CM

## 2020-09-14 DIAGNOSIS — N50812 Left testicular pain: Secondary | ICD-10-CM

## 2020-09-14 NOTE — Progress Notes (Addendum)
[facility] Clinic Progress/Follow-up Note   HPI:  35 y.o. Male presents to clinic for follow-up of testicular/perineal/anal rectal pain.  He reports he is significantly improved in regard to his pain issues.  Reports that his pain resolved following his ultrasound.  Never had a visit with a urologist yet.  Reports he has always had nocturia x2 during the evenings.  Reports he has also had some significant harder bowel movements that may be contributing to the pain.  He brought up the abnormality noted in his testicular ultrasound, and is well aware that he needs follow-up with urologist and repeat imaging. Review of Systems:  Constitutional: denies fever/chills  ENT: denies sore throat, hearing problems  Respiratory: denies shortness of breath, wheezing  Cardiovascular: denies chest pain, palpitations  Gastrointestinal: denies abdominal pain, N/V, or diarrhea/and bowel function Skin: Denies any other rashes or skin discolorations   Vital Signs:  BP 129/75   Pulse (!) 106   Temp 98.4 F (36.9 C) (Oral)   Ht 5\' 9"  (1.753 m)   Wt 175 lb (79.4 kg)   SpO2 98%   BMI 25.84 kg/m    Physical Exam:  Constitutional:  -- Normal body habitus  -- Awake, alert, and oriented x3  Pulmonary:  -- No crackles -- Equal breath sounds bilaterally -- Breathing non-labored at rest Cardiovascular:  -- S1, S2 present  -- No pericardial rubs  Gastrointestinal:  -- Soft and non-distended, non-tender GU  --exam not repeated. Musculoskeletal / Integumentary:  -- Wounds or skin discoloration: None appreciated  -- Extremities: B/L UE and LE FROM, hands and feet warm, no edema   Laboratory studies:  Radiology review: Testicular ultrasound report noted.  Imaging: No new pertinent imaging available for review   Assessment:  35 y.o. yo Male with a problem list including...  Patient Active Problem List   Diagnosis Date Noted  . COVID-19 vaccination declined 07/11/2020    presents to clinic for follow-up  evaluation of testicular perineal perianal pain, apparently resolved at present.  Progressing well.  Plan:   Reviewed follow-up with urologist as recommended on testicular ultrasound, with repeat imaging per interval. We discussed the role of fiber and fluids with adequacy of bowel function and avoiding.  Advised to pursue a goal of 25 to 30 g of fiber daily.  Made aware that the majority of this may be through natural sources, but advised to be aware of actual consumption and to ensure minimal consumption by daily supplementation.  Various forms of supplements discussed.  Strongly advised to consume more fluids to ensure adequate hydration, instructed to watch color of urine to determine adequacy of hydration.  Clarity is pursued in urine output, and bowel activity that correlates to significant meal intake.  Patient is to avoid deferring having bowel movements, advised to take the time at the first sign of sensation, typically following meals and in the morning.  Subsequent utilization of MiraLAX to ensure at least daily movement, ideally twice daily bowel movements.  If multiple doses of MiraLAX are necessary utilize them.             - return to clinic as needed, instructed to call office if any questions or concerns  All of the above recommendations were discussed with the patient, and all of patient'squestions were answered to his expressed satisfaction.  09/08/2020, MD, FACS [place]:  [facility] - Partnering for exceptional care. Office: [phone]

## 2020-09-14 NOTE — Patient Instructions (Addendum)
Eat foods high in fiber and drink plenty of water for constipation. Make an appointment with Urology. If you have any concerns or questions, please feel free to call our office.       High-Fiber Eating Plan Fiber, also called dietary fiber, is a type of carbohydrate. It is found foods such as fruits, vegetables, whole grains, and beans. A high-fiber diet can have many health benefits. Your health care provider may recommend a high-fiber diet to help:  Prevent constipation. Fiber can make your bowel movements more regular.  Lower your cholesterol.  Relieve the following conditions: ? Inflammation of veins in the anus (hemorrhoids). ? Inflammation of specific areas of the digestive tract (uncomplicated diverticulosis). ? A problem of the large intestine, also called the colon, that sometimes causes pain and diarrhea (irritable bowel syndrome, or IBS).  Prevent overeating as part of a weight-loss plan.  Prevent heart disease, type 2 diabetes, and certain cancers. What are tips for following this plan? Reading food labels  Check the nutrition facts label on food products for the amount of dietary fiber. Choose foods that have 5 grams of fiber or more per serving.  The goals for recommended daily fiber intake include: ? Men (age 53 or younger): 34-38 g. ? Men (over age 39): 28-34 g. ? Women (age 63 or younger): 25-28 g. ? Women (over age 71): 22-25 g. Your daily fiber goal is _____________ g.   Shopping  Choose whole fruits and vegetables instead of processed forms, such as apple juice or applesauce.  Choose a wide variety of high-fiber foods such as avocados, lentils, oats, and kidney beans.  Read the nutrition facts label of the foods you choose. Be aware of foods with added fiber. These foods often have high sugar and sodium amounts per serving. Cooking  Use whole-grain flour for baking and cooking.  Cook with brown rice instead of white rice. Meal planning  Start the day  with a breakfast that is high in fiber, such as a cereal that contains 5 g of fiber or more per serving.  Eat breads and cereals that are made with whole-grain flour instead of refined flour or white flour.  Eat brown rice, bulgur wheat, or millet instead of white rice.  Use beans in place of meat in soups, salads, and pasta dishes.  Be sure that half of the grains you eat each day are whole grains. General information  You can get the recommended daily intake of dietary fiber by: ? Eating a variety of fruits, vegetables, grains, nuts, and beans. ? Taking a fiber supplement if you are not able to take in enough fiber in your diet. It is better to get fiber through food than from a supplement.  Gradually increase how much fiber you consume. If you increase your intake of dietary fiber too quickly, you may have bloating, cramping, or gas.  Drink plenty of water to help you digest fiber.  Choose high-fiber snacks, such as berries, raw vegetables, nuts, and popcorn. What foods should I eat? Fruits Berries. Pears. Apples. Oranges. Avocado. Prunes and raisins. Dried figs. Vegetables Sweet potatoes. Spinach. Kale. Artichokes. Cabbage. Broccoli. Cauliflower. Green peas. Carrots. Squash. Grains Whole-grain breads. Multigrain cereal. Oats and oatmeal. Brown rice. Barley. Bulgur wheat. Millet. Quinoa. Bran muffins. Popcorn. Rye wafer crackers. Meats and other proteins Navy beans, kidney beans, and pinto beans. Soybeans. Split peas. Lentils. Nuts and seeds. Dairy Fiber-fortified yogurt. Beverages Fiber-fortified soy milk. Fiber-fortified orange juice. Other foods Fiber bars. The items listed  above may not be a complete list of recommended foods and beverages. Contact a dietitian for more information. What foods should I avoid? Fruits Fruit juice. Cooked, strained fruit. Vegetables Fried potatoes. Canned vegetables. Well-cooked vegetables. Grains White bread. Pasta made with refined  flour. White rice. Meats and other proteins Fatty cuts of meat. Fried chicken or fried fish. Dairy Milk. Yogurt. Cream cheese. Sour cream. Fats and oils Butters. Beverages Soft drinks. Other foods Cakes and pastries. The items listed above may not be a complete list of foods and beverages to avoid. Talk with your dietitian about what choices are best for you. Summary  Fiber is a type of carbohydrate. It is found in foods such as fruits, vegetables, whole grains, and beans.  A high-fiber diet has many benefits. It can help to prevent constipation, lower blood cholesterol, aid weight loss, and reduce your risk of heart disease, diabetes, and certain cancers.  Increase your intake of fiber gradually. Increasing fiber too quickly may cause cramping, bloating, and gas. Drink plenty of water while you increase the amount of fiber you consume.  The best sources of fiber include whole fruits and vegetables, whole grains, nuts, seeds, and beans. This information is not intended to replace advice given to you by your health care provider. Make sure you discuss any questions you have with your health care provider. Document Revised: 10/21/2019 Document Reviewed: 10/21/2019 Elsevier Patient Education  2021 [employer].

## 2021-02-14 ENCOUNTER — Ambulatory Visit: Payer: BC Managed Care – PPO | Admitting: Family Medicine

## 2021-02-14 ENCOUNTER — Encounter: Payer: Self-pay | Admitting: Family Medicine

## 2021-02-14 ENCOUNTER — Ambulatory Visit
Admission: RE | Admit: 2021-02-14 | Discharge: 2021-02-14 | Disposition: A | Discharge status: Home / Self Care | Payer: BC Managed Care – PPO | Source: Home / Self Care | Attending: Family Medicine | Admitting: Family Medicine

## 2021-02-14 ENCOUNTER — Ambulatory Visit
Admission: RE | Admit: 2021-02-14 | Discharge: 2021-02-14 | Disposition: A | Discharge status: Home / Self Care | Payer: BC Managed Care – PPO | Source: Ambulatory Visit | Attending: Family Medicine | Admitting: Family Medicine

## 2021-02-14 ENCOUNTER — Other Ambulatory Visit: Payer: Self-pay

## 2021-02-14 VITALS — BP 119/75 | HR 100 | Ht 69.0 in | Wt 167.2 lb

## 2021-02-14 DIAGNOSIS — Z23 Encounter for immunization: Secondary | ICD-10-CM | POA: Diagnosis not present

## 2021-02-14 DIAGNOSIS — M79642 Pain in left hand: Secondary | ICD-10-CM

## 2021-02-14 DIAGNOSIS — Z0389 Encounter for observation for other suspected diseases and conditions ruled out: Secondary | ICD-10-CM | POA: Diagnosis not present

## 2021-02-14 DIAGNOSIS — S61412A Laceration without foreign body of left hand, initial encounter: Secondary | ICD-10-CM | POA: Insufficient documentation

## 2021-02-14 DIAGNOSIS — M795 Residual foreign body in soft tissue: Secondary | ICD-10-CM | POA: Diagnosis not present

## 2021-02-14 NOTE — Patient Instructions (Addendum)
Thank you for coming to the office today.  Tetanus TDap shot today  X-ray left hand to look and confirm for metallic foreign body results later today  It may just be a puncture injury with some scar tissue on the tendon.  Can use warm water soaks to help draw out any possible sliver  Use topical antibiotic to protect skin - call if new concern or sign of redness or infection  Please schedule a Follow-up Appointment to: Return if symptoms worsen or fail to improve.  If you have any other questions or concerns, please feel free to call the office or send a message through MyChart. You may also schedule an earlier appointment if necessary.  Additionally, you may be receiving a survey about your experience at our office within a few days to 1 week by e-mail or mail. We value your feedback.  Saralyn Pilar, DO [facility], [place]

## 2021-02-14 NOTE — Progress Notes (Signed)
Subjective:    Patient ID: NYLEN CREQUE, male    DOB: 10-Dec-1985, 35 y.o.   MRN: [id number]  DELLA HOMAN is a 35 y.o. male presenting on 02/14/2021 for Foreign Body   HPI  Left Hand / Foreign Object metal Reports 2 weeks ago using hammer in R hand swung and hit a piece of metal and a sliver or piece of metal flew off quickly and hit directly into his Left hand that was at his side and it caused some bleeding and pain initially. It healed initially it was worse and now it has improved. Certain activities if he uses the Left hand will cause numbness and pain. Used some triple antibiotic ointment on it initially. It bleed initially then it resolved. He did some water soaks and it helped. - Has used a magnet to identify the piece of metal   Health Maintenance: TDap vaccine is due, overdue on tetanus  Depression screen [facility] 2/9 07/11/2020  Decreased Interest 0  Down, Depressed, Hopeless 0  PHQ - 2 Score 0    Social History   Tobacco Use   Smoking status: Never   Smokeless tobacco: Never  Vaping Use   Vaping Use: Some days  Substance Use Topics   Alcohol use: Never   Drug use: Yes    Types: Marijuana    Review of Systems Per HPI unless specifically indicated above     Objective:    BP 119/75   Pulse 100   Ht $R'5\' 9"'wg$  (1.753 m)   Wt 167 lb 3.2 oz (75.8 kg)   SpO2 95%   BMI 24.69 kg/m   Wt Readings from Last 3 Encounters:  02/14/21 167 lb 3.2 oz (75.8 kg)  09/14/20 175 lb (79.4 kg)  08/03/20 176 lb 9.6 oz (80.1 kg)    Physical Exam  _____________________________________________________________________ PROCEDURE NOTE Date: 02/14/21 Foreign Body Removal L Hand Verbal consent given by patient. Time out taken. Area cleaned with alcohol swabs. Local anesthesia with lidocaine 1% with epi 1 cc injected into site and surrounding tissue. Area prepped with betadine swabs x 3. Scalpel 15 blade used to make approx 1 cm superficial incision along center of possible  foreign body. With manipulation no successful identification of foreign body made. Laceration cleansed and steri strip placed.    Results for orders placed or performed during the hospital encounter of 07/12/20  SARS CORONAVIRUS 2 (TAT 6-24 HRS) Nasopharyngeal Nasopharyngeal Swab   Specimen: Nasopharyngeal Swab  Result Value Ref Range   SARS Coronavirus 2 POSITIVE (A) NEGATIVE  Rapid Influenza A&B Antigens   Specimen: Flu Kit Nasopharyngeal Swab; Respiratory  Result Value Ref Range   Influenza A ([facility]) NEGATIVE NEGATIVE   Influenza B ([facility]) NEGATIVE NEGATIVE      Assessment & Plan:   Problem List Items Addressed This Visit   None Visit Diagnoses     Left hand pain    -  Primary   Relevant Orders   DG Hand Complete Left   Need for diphtheria-tetanus-pertussis (Tdap) vaccine       Laceration of left hand, foreign body presence unspecified, initial encounter       Relevant Orders   Tdap vaccine greater than or equal to 7yo IM   DG Hand Complete Left       Possible Left hand retained metallic foreign body from hammering 2 weeks ago Site has since healed up, but he has felt sensation of retained foreign body Worse with movements repetitive impacting his tendon  Attempted exploration with superficial incision today, see procedure notes for details. Unsuccessful in locating and removing metallic foreign body. Not entirely convinced there is a retained foreign body after exam today.  Will pursue X-ray L hand today in office STAT to review and follow-up with patient.  Can use warm water soaks to help draw out any possible sliver  Use topical antibiotic to protect skin - call if new concern or sign of redness or infection   Follow-up as advised if not improving. We can consider consult with Gen Surg to remove it if needed.  Orders Placed This Encounter  Procedures   DG Hand Complete Left    Standing Status:   Future    Standing Expiration Date:   02/14/2022    Order Specific  Question:   Reason for Exam (SYMPTOM  OR DIAGNOSIS REQUIRED)    Answer:   left hand possible metal foreign body retained at base of thumb, possible metallic sliver    Order Specific Question:   Preferred imaging location?    Answer:   [facility]   Tdap vaccine greater than or equal to 7yo IM     No orders of the defined types were placed in this encounter.    Follow up plan: Return if symptoms worsen or fail to improve.  Nobie Putnam, [facility] 02/14/2021, 9:58 AM

## 2021-02-15 ENCOUNTER — Telehealth: Payer: Self-pay | Admitting: Family Medicine

## 2021-02-15 DIAGNOSIS — M79642 Pain in left hand: Secondary | ICD-10-CM

## 2021-02-15 DIAGNOSIS — S60552A Superficial foreign body of left hand, initial encounter: Secondary | ICD-10-CM

## 2021-02-15 NOTE — Telephone Encounter (Signed)
I called the pt to give him the information about the referral but he said he is at work and couldn't talk, so he asked me to call him back around 4pm tomorrow. I will f/u with him around that time.

## 2021-02-15 NOTE — Telephone Encounter (Signed)
I have called [facility] and they have scheduled the patient for a consultation with Gen Surgery for removal of the foreign body metal pieces in his Left hand.  Please notify patient that he has been scheduled for Thursday 8/25 next week at 9am at their office.  If he needs to re-schedule he should call them >24 hours in advance of that appointment.  It would be a "speciality copay visit", they will evaluate the hand and figure out a plan - either in office removal or other option. I am not 100% sure if they will be removing it on that first visit.  Saralyn Pilar, DO [facility] 02/15/2021, 9:28 AM

## 2021-02-19 NOTE — Telephone Encounter (Signed)
Pt called in and was informed of his upcoming new pt appt at [facility], wanted Irving Burton to know she does not need to call him back.

## 2021-02-21 NOTE — Telephone Encounter (Signed)
I have seen the message. Thank you

## 2021-02-22 ENCOUNTER — Encounter: Payer: Self-pay | Admitting: Surgery

## 2021-02-22 ENCOUNTER — Other Ambulatory Visit: Payer: Self-pay

## 2021-02-22 ENCOUNTER — Ambulatory Visit: Payer: BC Managed Care – PPO | Admitting: Surgery

## 2021-02-22 VITALS — BP 110/66 | HR 78 | Temp 98.0°F | Ht 69.0 in | Wt 165.0 lb

## 2021-02-22 DIAGNOSIS — S60552A Superficial foreign body of left hand, initial encounter: Secondary | ICD-10-CM | POA: Diagnosis not present

## 2021-02-22 DIAGNOSIS — S60552S Superficial foreign body of left hand, sequela: Secondary | ICD-10-CM

## 2021-02-22 DIAGNOSIS — S60512A Abrasion of left hand, initial encounter: Secondary | ICD-10-CM | POA: Diagnosis not present

## 2021-02-22 NOTE — Progress Notes (Signed)
Patient ID: Frederick Parrish, male   DOB: [birth year], 35 y.o.   MRN: [id number]  Chief Complaint: Foreign body left hand  History of Present Illness Frederick Parrish is a 35 y.o. male with known foreign bodies to the left hand x2.  Patient was disassembling/deconstructing air conditioning unit and felt shards of metal injure his left hand.  1 is in the snuffbox area, the other is dorsal to his fourth metacarpal joint.  This occurred 3 weeks ago attempts to remove/retrieve the foreign body at the snuffbox area were not successful.  There is always been a palpable scar in the area of its entry.  His only symptom is that he feels some twinge when he rubs his finger over the area that radiates down into his index finger.  If he does not touch the area he feels no pain.  Has no difficulty with range of motion of the left hand and is essentially asymptomatic. X-rays confirm the presence of the foreign bodies.    Past Medical History History reviewed. No pertinent past medical history.    History reviewed. No pertinent surgical history.  No Known Allergies  Current Outpatient Medications  Medication Sig Dispense Refill   Ascorbic Acid (VITAMIN C) 1000 MG tablet Take 1,000 mg by mouth daily.     Multiple Vitamin (MULTIVITAMIN) tablet Take 1 tablet by mouth daily.     omega-3 acid ethyl esters (LOVAZA) 1 g capsule Take 1 g by mouth 2 (two) times daily.     vitamin B-12 (CYANOCOBALAMIN) 500 MCG tablet Take 500 mcg by mouth daily.     No current facility-administered medications for this visit.    Family History Family History  Problem Relation Age of Onset   Alcohol abuse Mother    Cancer Paternal Grandfather    Diabetes Paternal Grandfather       Social History Social History   Tobacco Use   Smoking status: Never   Smokeless tobacco: Never  Vaping Use   Vaping Use: Some days  Substance Use Topics   Alcohol use: Never   Drug use: Yes    Types: Marijuana        Review of  Systems  Constitutional: Negative.   HENT: Negative.    Eyes: Negative.   Respiratory: Negative.    Cardiovascular: Negative.   Gastrointestinal: Negative.   Genitourinary: Negative.   Skin: Negative.   Neurological: Negative.   Psychiatric/Behavioral: Negative.    Reports a chest tightness experienced the same time he got very upset at work, this is only occurred 1 time recently, last night.  Physical Exam Blood pressure 110/66, pulse 78, temperature 98 F (36.7 C), height 5\' 9"  (1.753 m), weight 165 lb (74.8 kg), SpO2 97 %. Last Weight  Most recent update: 02/22/2021  9:19 AM    Weight  74.8 kg (165 lb)             CONSTITUTIONAL: Well developed, and nourished, appropriately responsive and aware without distress.   EYES: Sclera non-icteric.   EARS, NOSE, MOUTH AND THROAT: Mask worn.  Hearing is intact to voice.  RESPIRATORY:  Lungs are clear. Normal respiratory effort without pathologic use of accessory muscles.  A more exact CARDIOVASCULAR: Heart is regular in rate and rhythm. MUSCULOSKELETAL:  Symmetrical muscle tone appreciated in all four extremities.    SKIN: Skin turgor is normal. No pathologic skin lesions appreciated.  There is a small well-healed scar over the snuffbox and the left dorsal hand, I can only appreciate  feeling the scar.  Upon reviewing the x-rays, I am convinced I cannot possibly palpate the metallic fragment there.  On palpating this area is nontender and does not seem to create any significant distress or issue. NEUROLOGIC:  Motor and sensation appear grossly normal.  Cranial nerves are grossly without defect. PSYCH:  Alert and oriented to person, place and time. Affect is appropriate for situation.  Data Reviewed I have personally reviewed what is currently available of the patient's imaging, recent labs and medical records.   Labs:  CBC Latest Ref Rng & Units 05/19/2018  WBC 4.0 - 10.5 K/uL 11.1(H)  Hemoglobin 13.0 - 17.0 g/dL 86.5  Hematocrit  78.4 - 52.0 % 44.2  Platelets 150 - 400 K/uL 265   CMP Latest Ref Rng & Units 05/19/2018  Glucose 70 - 99 mg/dL 98  BUN 6 - 20 mg/dL 13  Creatinine 6.96 - 2.95 mg/dL 2.84  Sodium 132 - 440 mmol/L 141  Potassium 3.5 - 5.1 mmol/L 3.5  Chloride 98 - 111 mmol/L 107  CO2 22 - 32 mmol/L 23  Calcium 8.9 - 10.3 mg/dL 9.2  Total Protein 6.5 - 8.1 g/dL 8.0  Total Bilirubin 0.3 - 1.2 mg/dL 0.9  Alkaline Phos 38 - 126 U/L 51  AST 15 - 41 U/L 18  ALT 0 - 44 U/L 16      Imaging: Radiology review:  CLINICAL DATA:  Left hand possible foreign body.   EXAM: LEFT HAND - COMPLETE 3+ VIEW   COMPARISON:  None.   FINDINGS: No acute fracture or dislocation. No aggressive osseous lesion. Normal alignment.   Soft tissue are unremarkable. 3 mm metallic foreign body in the soft tissues dorsal to the fourth metacarpal head. 1-2 mm metallic foreign body along the radial aspect of the scaphoid-trapezium joint.   IMPRESSION: No acute osseous injury of the left hand.     Electronically Signed   By: Elige Ko M.D.   On: 02/14/2021 11:22 Within last 24 hrs: No results found.  Assessment    Metallic foreign bodies left hand.  Essentially asymptomatic aside from damage/scar resulting from the injury. Patient Active Problem List   Diagnosis Date Noted   COVID-19 vaccination declined 07/11/2020    Plan    I am quite certain that pursuing this tiny fragment would likely cause more harm than good.  Offered another opinion to see a Hydrographic surveyor.  I believe the patient is confident that leaving this fragment alone will not lead to any further morbidity.  And current morbidity essentially nonexistent aside from rubbing or palpating the area.  I would like to see him back as needed.  Face-to-face time spent with the patient and accompanying care providers(if present) was 25 minutes, with more than 50% of the time spent counseling, educating, and coordinating care of the patient.    These notes  generated with voice recognition software. I apologize for typographical errors.  Campbell Lerner M.D., FACS 02/22/2021, 9:49 AM

## 2021-02-22 NOTE — Patient Instructions (Signed)
Follow-up with our office as needed.  Please call and ask to speak with a nurse if you develop questions or concerns.   Let us know if you would like a referral to an orthopedic hand specialists referral.

## 2021-02-27 DIAGNOSIS — Z20822 Contact with and (suspected) exposure to covid-19: Secondary | ICD-10-CM | POA: Diagnosis not present

## 2021-03-23 ENCOUNTER — Encounter: Payer: Self-pay | Admitting: General Surgery

## 2022-11-17 IMAGING — US US SCROTUM W/ DOPPLER COMPLETE
1 series · 13 of 25 positions shown · non-contrast
Comparison: CT pelvis dated July 31, 2020

CLINICAL DATA: Varicocele.  Right testicular pain x4 months

EXAM:
SCROTAL ULTRASOUND
DOPPLER ULTRASOUND OF THE TESTICLES
TECHNIQUE: Complete ultrasound examination of the testicles, epididymis, and
other scrotal structures was performed. Color and spectral Doppler
ultrasound were also utilized to evaluate blood flow to the
testicles.

[Series 1: us scrotum w/ doppler complete · 0.07mm/px · 13 of 77 slices shown]
[im 1/77]
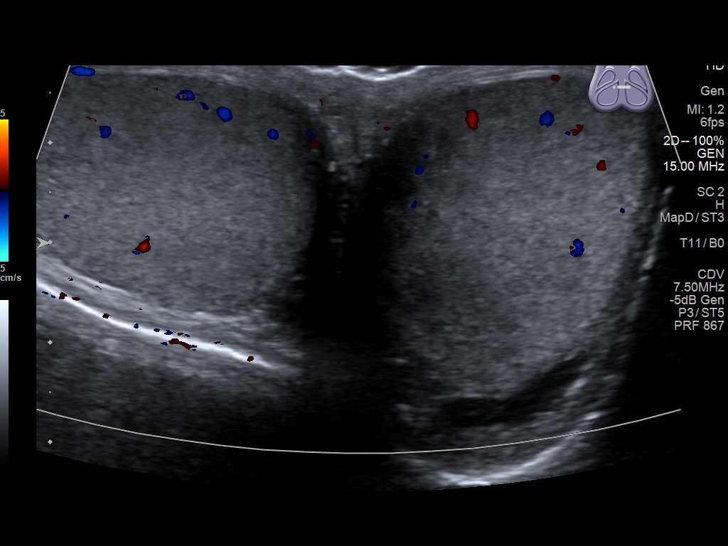
[im 7/77]
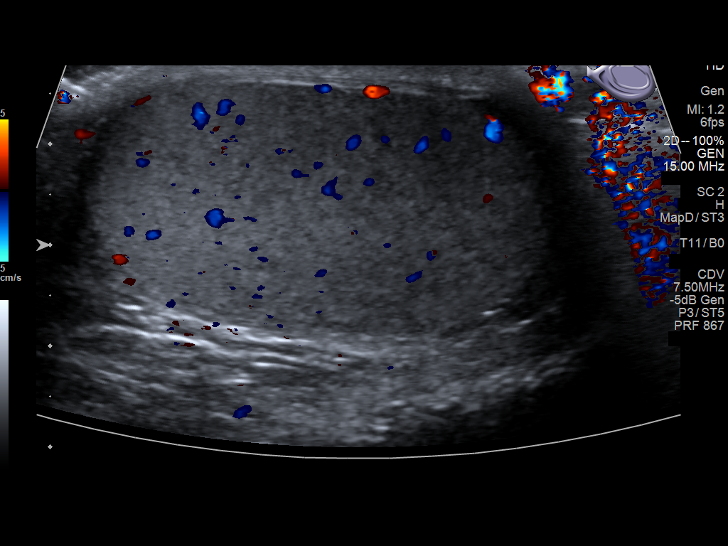
[im 13/77]
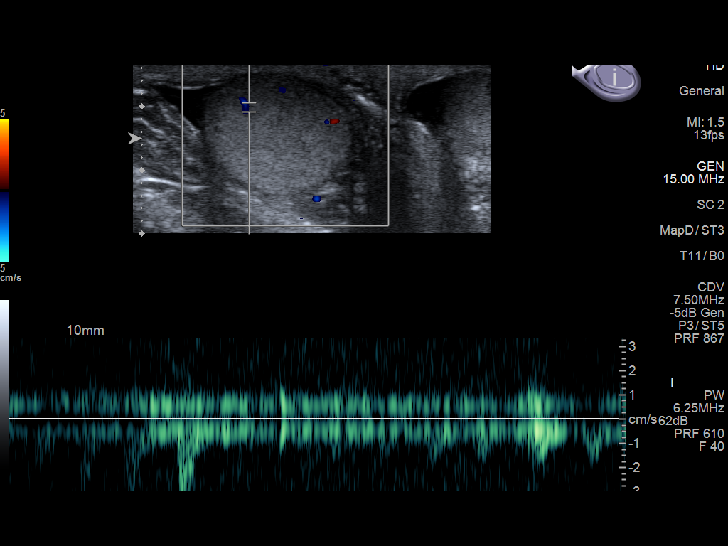
[im 20/77]
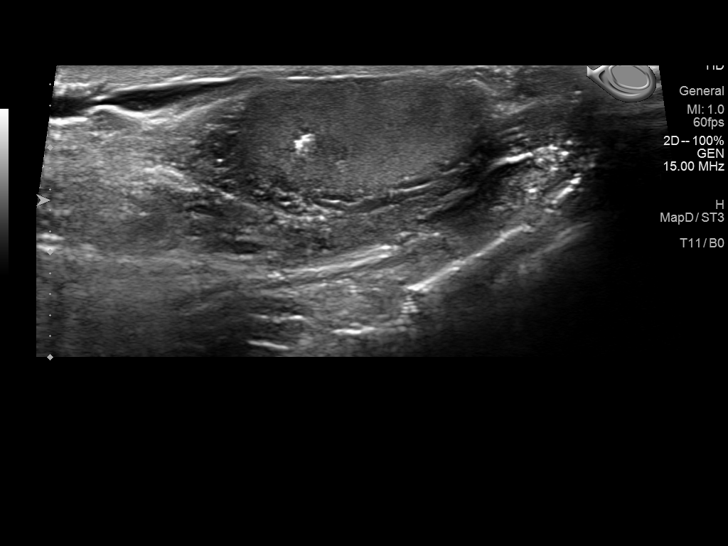
[im 26/77]
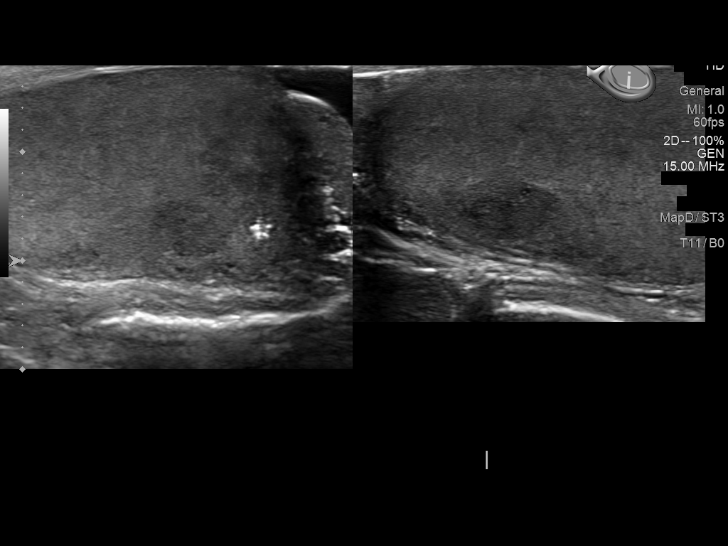
[im 32/77]
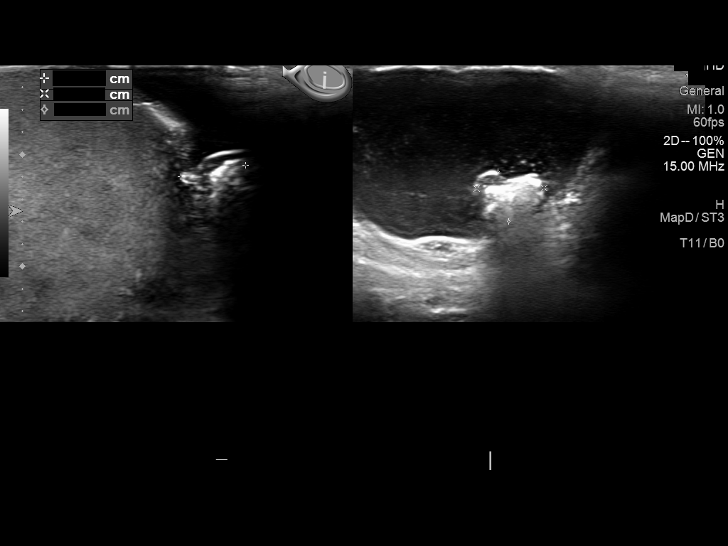
[im 39/77]
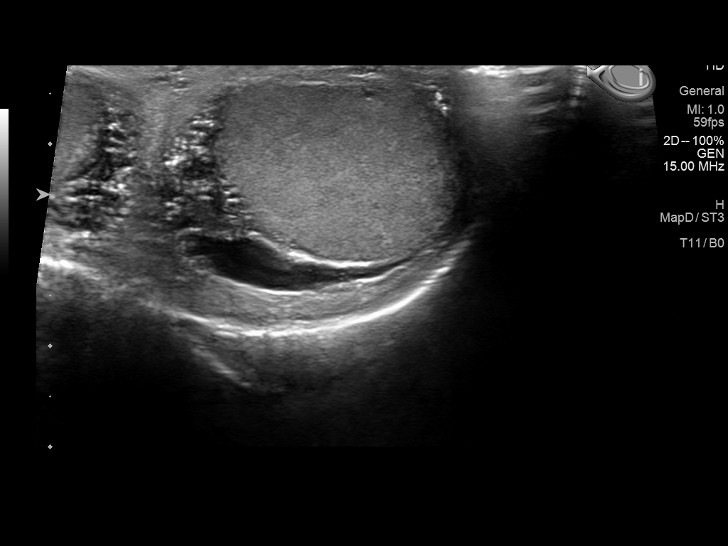
[im 45/77]
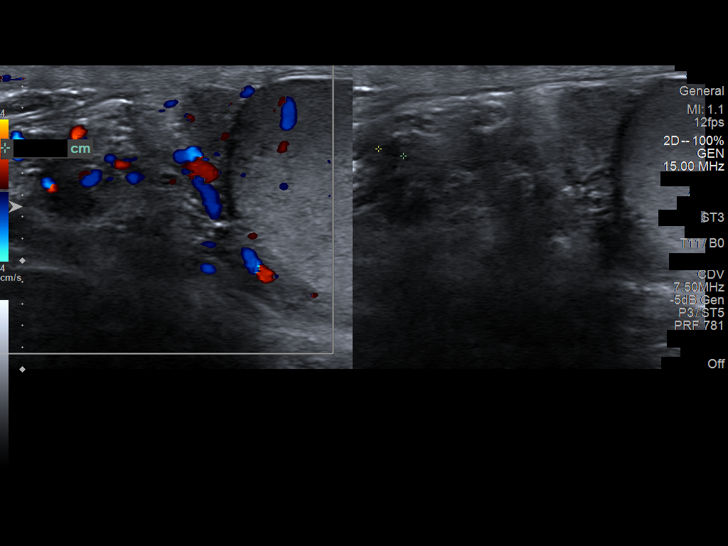
[im 51/77]
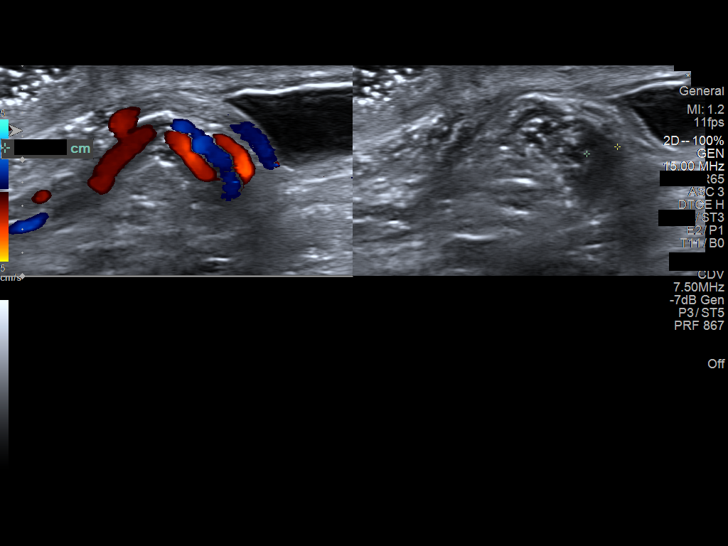
[im 58/77]
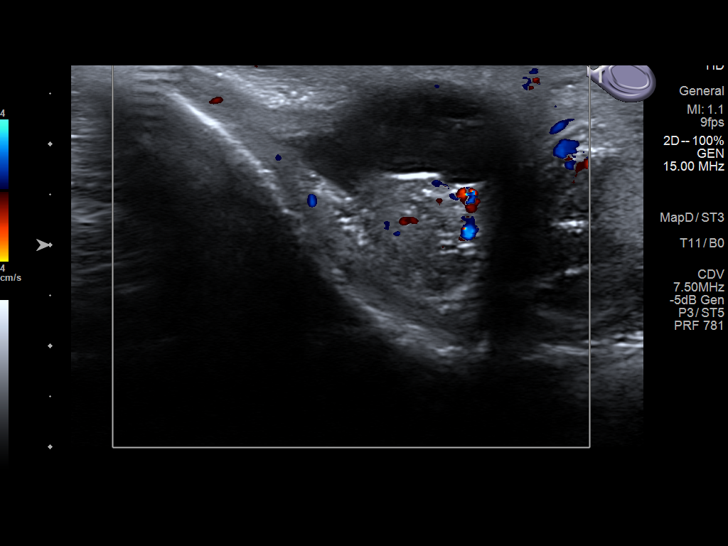
[im 64/77]
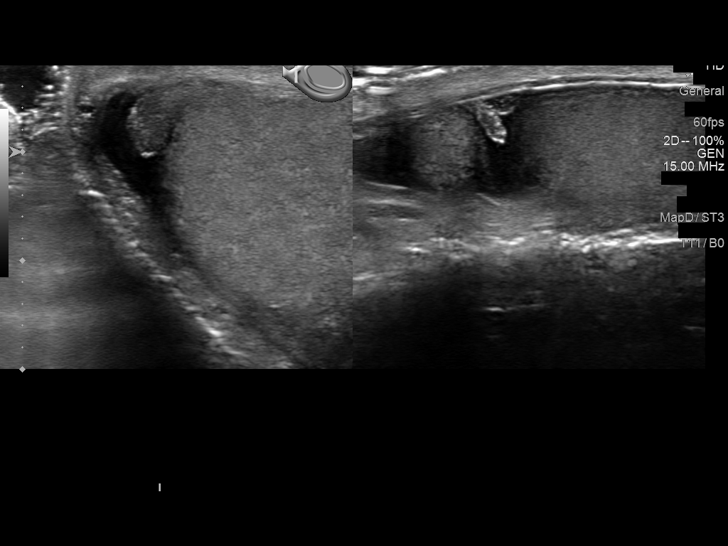
[im 70/77]
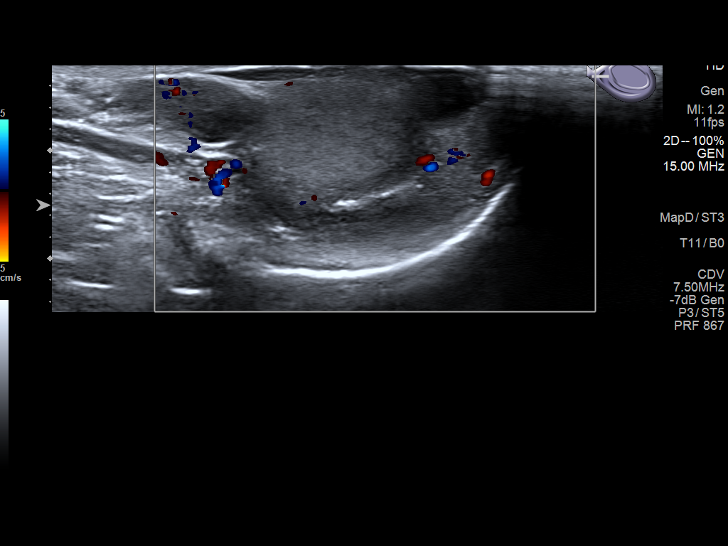
[im 77/77]
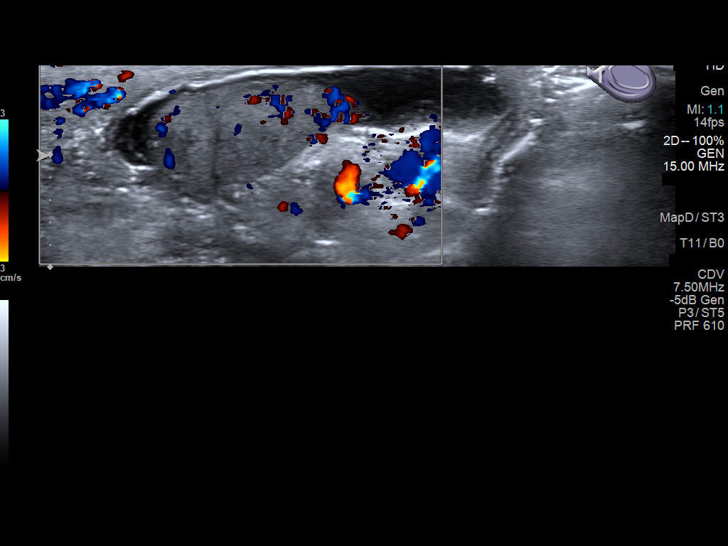

[13 of 25 positions shown; findings below may reference images not displayed]

FINDINGS: Right testicle

Measurements: 4.6 x 2.4 x 3 cm. No mass or microlithiasis
visualized.

Left testicle

Measurements: 4.4 x 2 x 3.1 cm. There is a small calcification.
Adjacent to this calcification is an ill-defined hypoechoic 0.9 x
0.5 x 0.7 cm mass. Adjacent to this mass is an additional 0.6 x
x 0.6 cm hypoechoic mass. The testicular parenchyma adjacent to
these potential masses is overall heterogeneous. In the right
hemiscrotum, there is a 6 mm calcified scrotal pearl.

Right epididymis:  Normal in size and appearance.

Left epididymis:  Normal in size and appearance.

Hydrocele:  There are small bilateral hydroceles.

Varicocele:  None visualized.

Pulsed Doppler interrogation of both testes demonstrates normal low
resistance arterial and venous waveforms bilaterally.
IMPRESSION: 1. No evidence for testicular torsion.
2. Heterogeneous appearance of the left testicle with small,
subcentimeter, ill-defined hypoechoic areas suspicious for
underlying masses. Urology follow-up is recommended. A short-term
interval follow-up scrotal ultrasound may be useful.
3. There are small bilateral hydroceles.

## 2023-05-27 IMAGING — DX DG HAND COMPLETE 3+V*L*
3 series · 3 of 3 positions shown · non-contrast
Comparison: None.

CLINICAL DATA: Left hand possible foreign body.

EXAM:
LEFT HAND - COMPLETE 3+ VIEW

[hand ap]
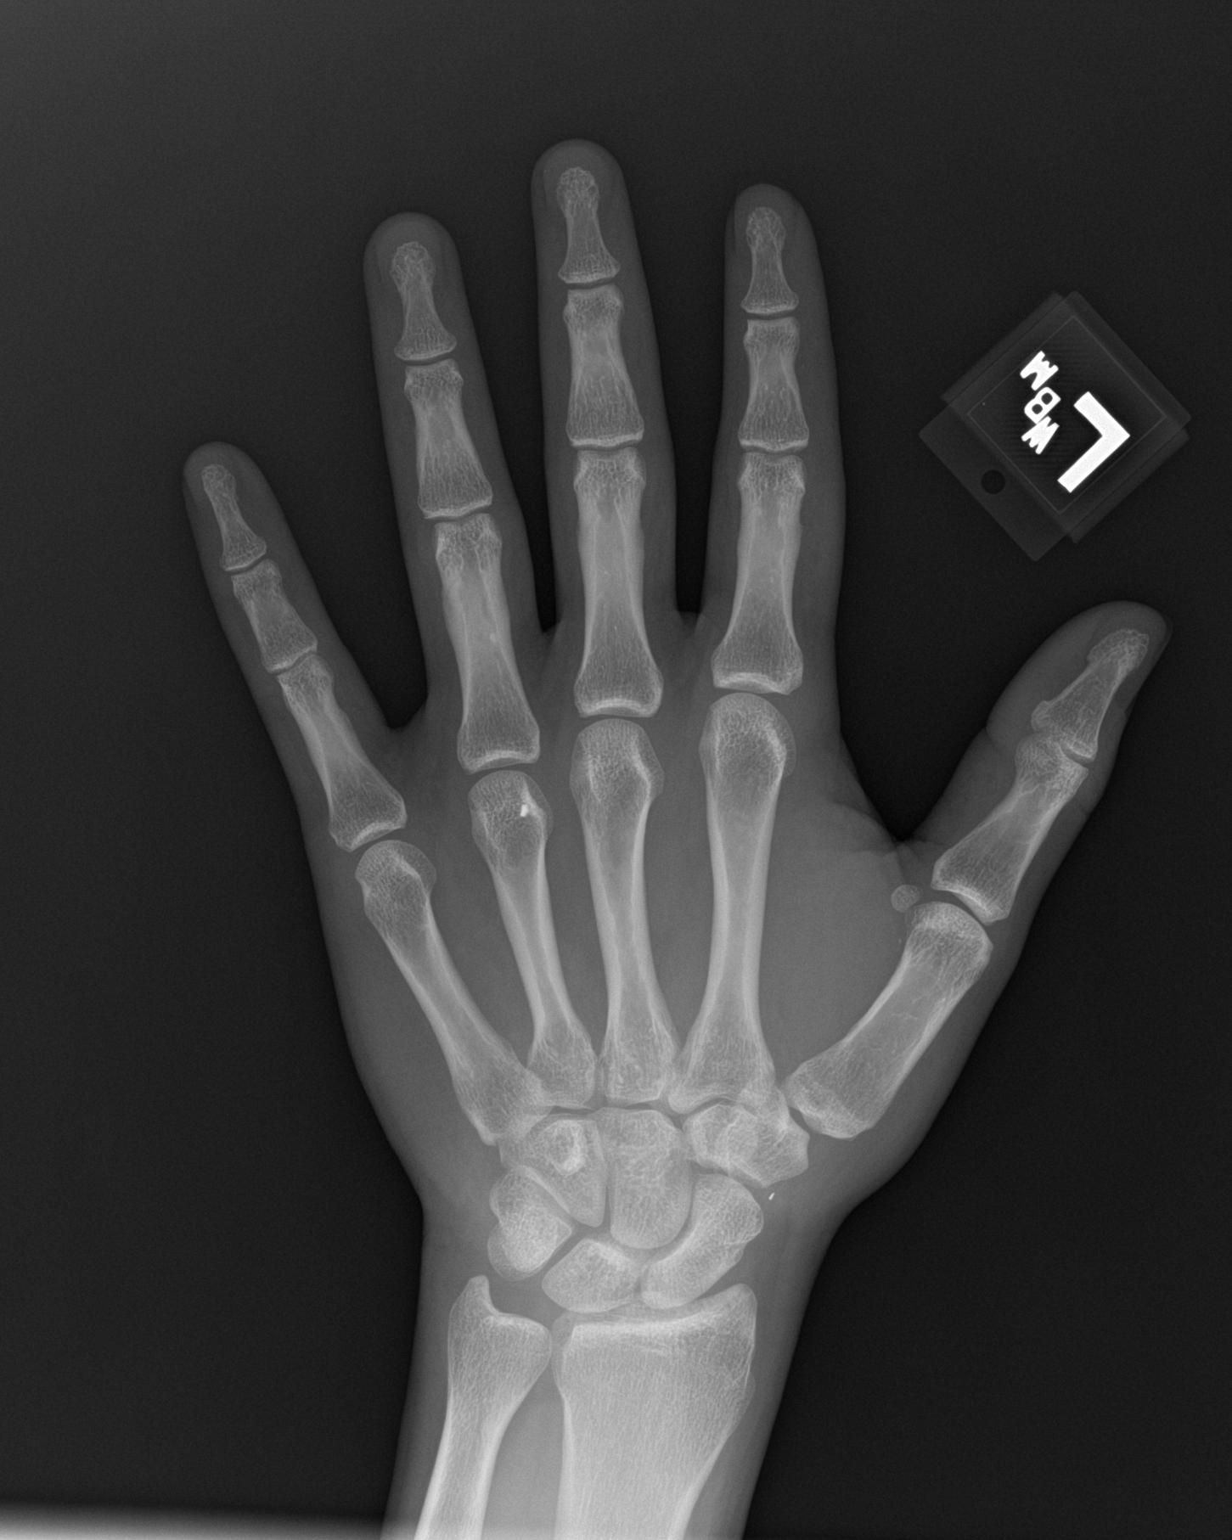

[hand obl]
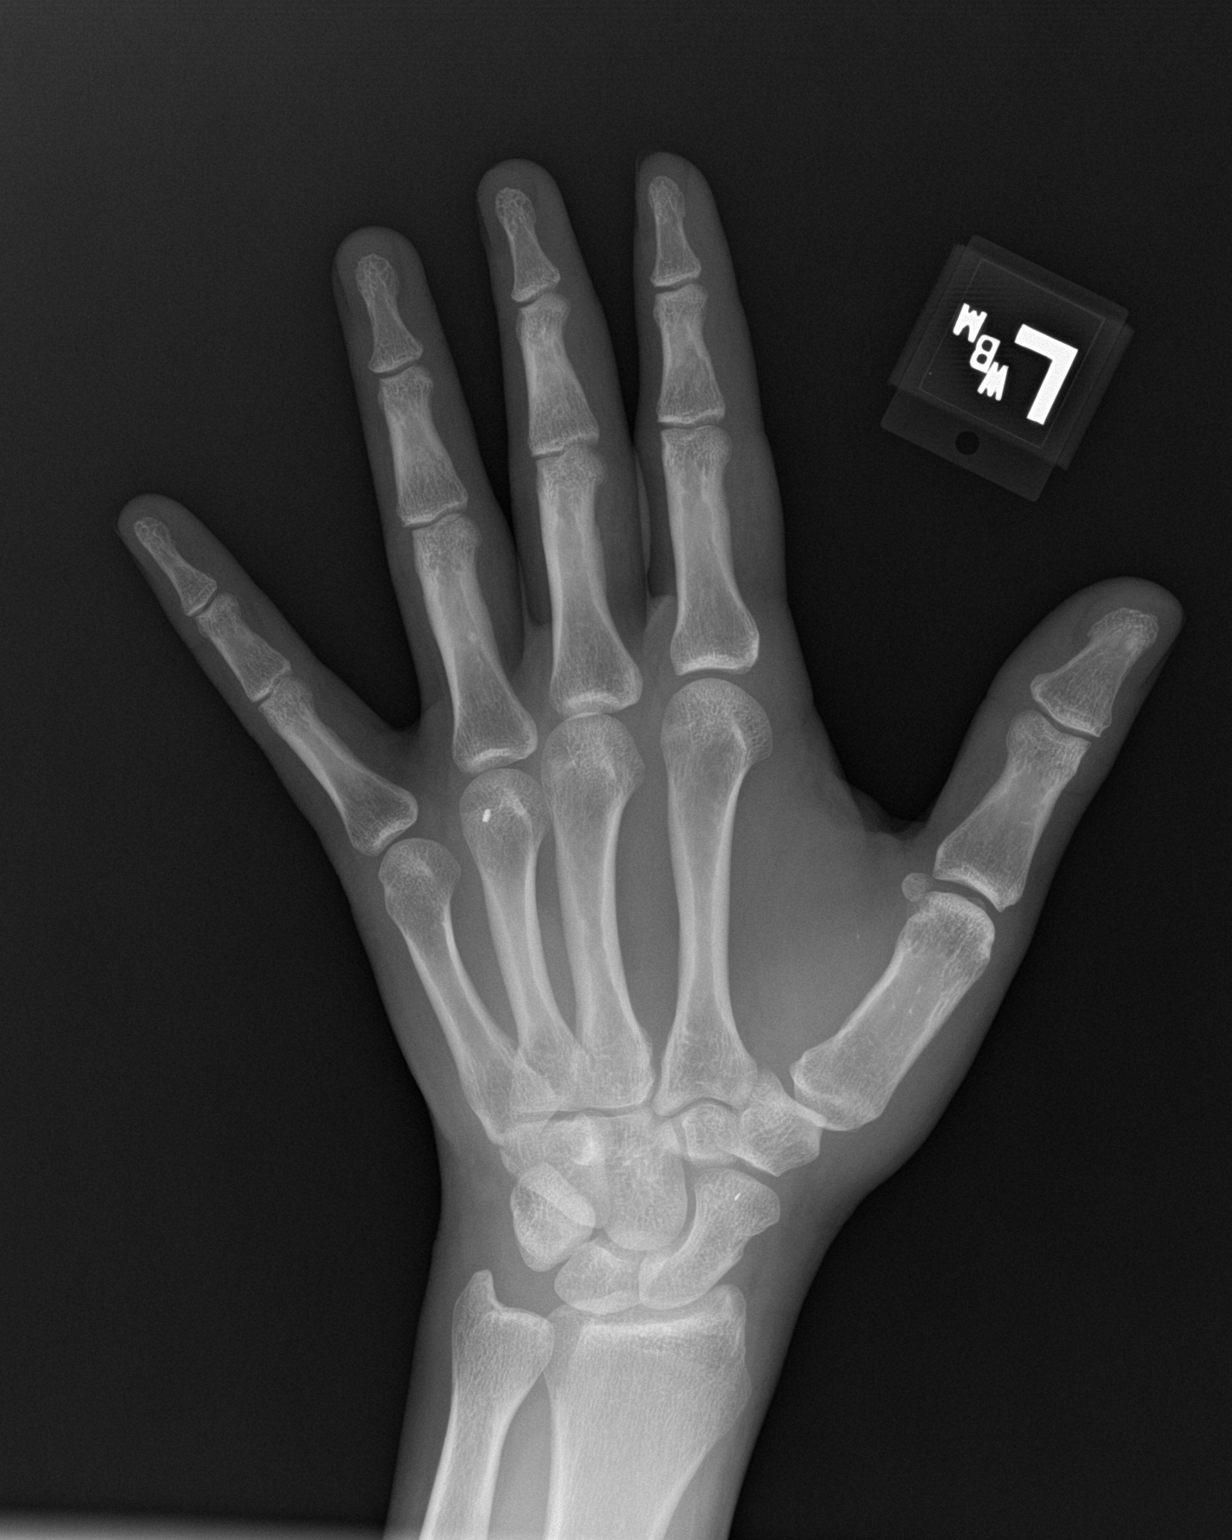

[hand lat]
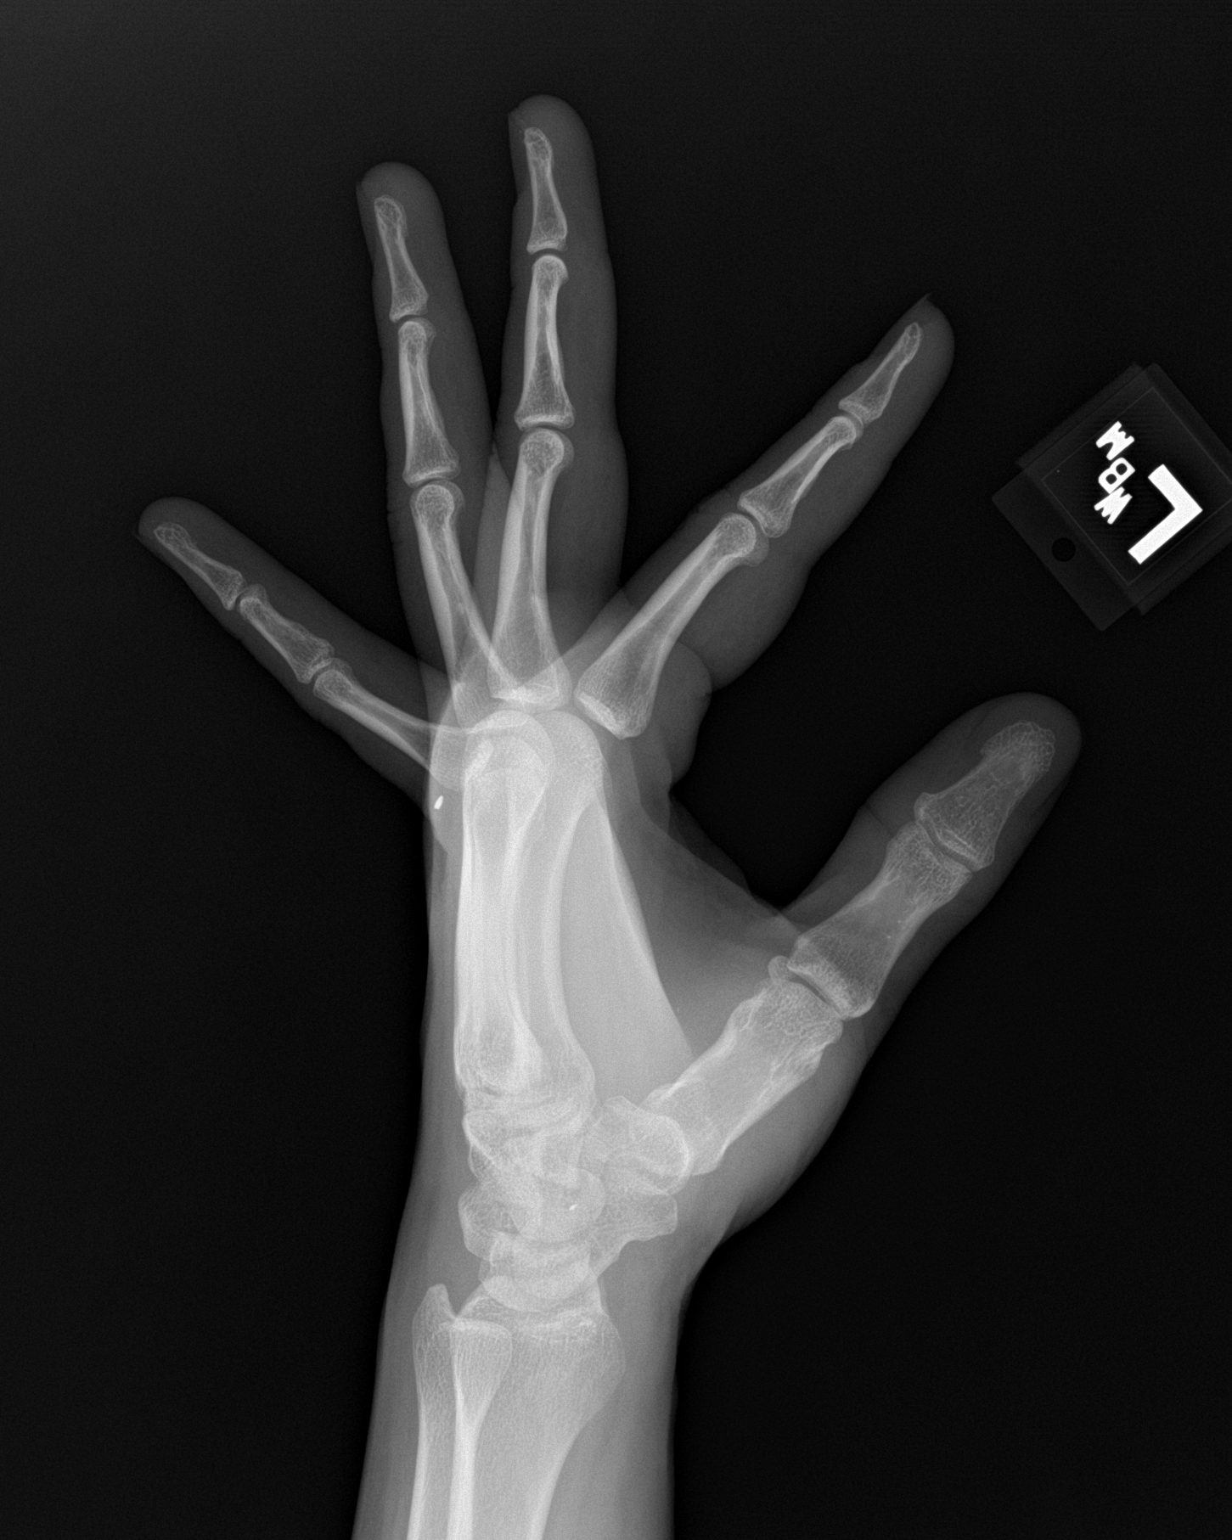

[3 of 3 positions shown; findings below may reference images not displayed]

FINDINGS: No acute fracture or dislocation. No aggressive osseous lesion.
Normal alignment.

Soft tissue are unremarkable. 3 mm metallic foreign body in the soft
tissues dorsal to the fourth metacarpal head. 1-2 mm metallic
foreign body along the radial aspect of the scaphoid-trapezium
joint.
IMPRESSION: No acute osseous injury of the left hand.
